# Patient Record
Sex: Female | Born: 1995 | Race: White | Hispanic: No | Marital: Single | Smoking: Former smoker
Health system: Southern US, Community
[De-identification: ages and names within clinical notes are randomized; demographics above are authoritative.]

## PROBLEM LIST (undated history)

## (undated) DIAGNOSIS — F419 Anxiety disorder, unspecified: Secondary | ICD-10-CM

## (undated) DIAGNOSIS — R87629 Unspecified abnormal cytological findings in specimens from vagina: Secondary | ICD-10-CM

## (undated) HISTORY — PX: OTHER SURGICAL HISTORY: SHX169

## (undated) HISTORY — PX: TONSILLECTOMY: SUR1361

## (undated) HISTORY — DX: Anxiety disorder, unspecified: F41.9

## (undated) HISTORY — DX: Unspecified abnormal cytological findings in specimens from vagina: R87.629

---
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
## 2014-08-31 ENCOUNTER — Emergency Department
Admission: EM | Admit: 2014-08-31 | Discharge: 2014-08-31 | Disposition: A | Discharge status: Home / Self Care | Payer: Commercial Managed Care - PPO | Source: Home / Self Care | Attending: Emergency Medicine | Admitting: Emergency Medicine

## 2014-08-31 ENCOUNTER — Encounter: Payer: Self-pay | Admitting: Emergency Medicine

## 2014-08-31 DIAGNOSIS — H109 Unspecified conjunctivitis: Secondary | ICD-10-CM

## 2014-08-31 MED ORDER — TOBRAMYCIN 0.3 % OP SOLN: 2.0000 [drp] | OPHTHALMIC | Status: DC

## 2014-08-31 NOTE — Discharge Instructions (Signed)
Bacterial Conjunctivitis °Bacterial conjunctivitis, commonly called pink eye, is an inflammation of the clear membrane that covers the white part of the eye (conjunctiva). The inflammation can also happen on the underside of the eyelids. The blood vessels in the conjunctiva become inflamed, causing the eye to become red or pink. Bacterial conjunctivitis may spread easily from one eye to another and from person to person (contagious).  °CAUSES  °Bacterial conjunctivitis is caused by bacteria. The bacteria may come from your own skin, your upper respiratory tract, or from someone else with bacterial conjunctivitis. °SYMPTOMS  °The normally white color of the eye or the underside of the eyelid is usually pink or red. The pink eye is usually associated with irritation, tearing, and some sensitivity to light. Bacterial conjunctivitis is often associated with a thick, yellowish discharge from the eye. The discharge may turn into a crust on the eyelids overnight, which causes your eyelids to stick together. If a discharge is present, there may also be some blurred vision in the affected eye. °DIAGNOSIS  °Bacterial conjunctivitis is diagnosed by your caregiver through an eye exam and the symptoms that you report. Your caregiver looks for changes in the surface tissues of your eyes, which may point to the specific type of conjunctivitis. A sample of any discharge may be collected on a cotton-tip swab if you have a severe case of conjunctivitis, if your cornea is affected, or if you keep getting repeat infections that do not respond to treatment. The sample will be sent to a lab to see if the inflammation is caused by a bacterial infection and to see if the infection will respond to antibiotic medicines. °TREATMENT  °· Bacterial conjunctivitis is treated with antibiotics. Antibiotic eyedrops are most often used. However, antibiotic ointments are also available. Antibiotics pills are sometimes used. Artificial tears or eye  washes may ease discomfort. °HOME CARE INSTRUCTIONS  °· To ease discomfort, apply a cool, clean washcloth to your eye for 10-20 minutes, 3-4 times a day. °· Gently wipe away any drainage from your eye with a warm, wet washcloth or a cotton ball. °· Wash your hands often with soap and water. Use paper towels to dry your hands. °· Do not share towels or washcloths. This may spread the infection. °· Change or wash your pillowcase every day. °· You should not use eye makeup until the infection is gone. °· Do not operate machinery or drive if your vision is blurred. °· Stop using contact lenses. Ask your caregiver how to sterilize or replace your contacts before using them again. This depends on the type of contact lenses that you use. °· When applying medicine to the infected eye, do not touch the edge of your eyelid with the eyedrop bottle or ointment tube. °SEEK IMMEDIATE MEDICAL CARE IF:  °· Your infection has not improved within 3 days after beginning treatment. °· You had yellow discharge from your eye and it returns. °· You have increased eye pain. °· Your eye redness is spreading. °· Your vision becomes blurred. °· You have a fever or persistent symptoms for more than 2-3 days. °· You have a fever and your symptoms suddenly get worse. °· You have facial pain, redness, or swelling. °MAKE SURE YOU:  °· Understand these instructions. °· Will watch your condition. °· Will get help right away if you are not doing well or get worse. °Document Released: 10/18/2005 Document Revised: 03/04/2014 Document Reviewed: 03/20/2012 °ExitCare® Patient Information ©2015 [employer]. This information is not intended to   replace advice given to you by your health care provider. Make sure you discuss any questions you have with your health care provider. ° °Sty °A sty (hordeolum) is an infection of a gland in the eyelid located at the base of the eyelash. A sty may develop a white or yellow head of pus. It can be puffy (swollen).  Usually, the sty will burst and pus will come out on its own. They do not leave lumps in the eyelid once they drain. °A sty is often confused with another form of cyst of the eyelid called a chalazion. Chalazions occur within the eyelid and not on the edge where the bases of the eyelashes are. They often are red, sore and then form firm lumps in the eyelid. °CAUSES  °· Germs (bacteria). °· Lasting (chronic) eyelid inflammation. °SYMPTOMS  °· Tenderness, redness and swelling along the edge of the eyelid at the base of the eyelashes. °· Sometimes, there is a white or yellow head of pus. It may or may not drain. °DIAGNOSIS  °An ophthalmologist will be able to distinguish between a sty and a chalazion and treat the condition appropriately.  °TREATMENT  °· Styes are typically treated with warm packs (compresses) until drainage occurs. °· In rare cases, medicines that kill germs (antibiotics) may be prescribed. These antibiotics may be in the form of drops, cream or pills. °· If a hard lump has formed, it is generally necessary to do a small incision and remove the hardened contents of the cyst in a minor surgical procedure done in the office. °· In suspicious cases, your caregiver may send the contents of the cyst to the lab to be certain that it is not a rare, but dangerous form of cancer of the glands of the eyelid. °HOME CARE INSTRUCTIONS  °· Wash your hands often and dry them with a clean towel. Avoid touching your eyelid. This may spread the infection to other parts of the eye. °· Apply heat to your eyelid for 10 to 20 minutes, several times a day, to ease pain and help to heal it faster. °· Do not squeeze the sty. Allow it to drain on its own. Wash your eyelid carefully 3 to 4 times per day to remove any pus. °SEEK IMMEDIATE MEDICAL CARE IF:  °· Your eye becomes painful or puffy (swollen). °· Your vision changes. °· Your sty does not drain by itself within 3 days. °· Your sty comes back within a short period of  time, even with treatment. °· You have redness (inflammation) around the eye. °· You have a fever. °Document Released: 07/28/2005 Document Revised: 01/10/2012 Document Reviewed: 02/01/2014 °ExitCare® Patient Information ©2015 [employer]. This information is not intended to replace advice given to you by your health care provider. Make sure you discuss any questions you have with your health care provider. ° °

## 2014-08-31 NOTE — ED Notes (Signed)
Pt complains of R eye itching, swelling and draining for 6 days.  Around her R eye is swollen and red.

## 2014-08-31 NOTE — ED Provider Notes (Signed)
CSN: [id number]     Arrival date & time 08/31/14  1016 History   First MD Initiated Contact with Patient 08/31/14 1024     Chief Complaint  Patient presents with  . Eye Drainage  . Facial Swelling    R eye   (Consider location/radiation/quality/duration/timing/severity/associated sxs/prior Treatment) Patient is a 18 y.o. female presenting with conjunctivitis. The history is provided by the patient. No language interpreter was used.  Conjunctivitis This is a new problem. The current episode started more than 1 week ago. The problem occurs constantly. The problem has been gradually worsening. Nothing aggravates the symptoms. Nothing relieves the symptoms. She has tried nothing for the symptoms. The treatment provided no relief.  Pt reports top eyelid swelled first, now bottom eyelid,  Pt has had drainage on and off.    History reviewed. No pertinent past medical history. Past Surgical History  Procedure Laterality Date  . Tonsillectomy    . Broken leg      screws and rods in L leg   History reviewed. No pertinent family history. History  Substance Use Topics  . Smoking status: Current Every Day Smoker  . Smokeless tobacco: Never Used  . Alcohol Use: No   OB History   Grav Para Term Preterm Abortions TAB SAB Ect Mult Living                 Review of Systems  All other systems reviewed and are negative.   Allergies  Review of patient's allergies indicates no known allergies.  Home Medications   Prior to Admission medications   Medication Sig Start Date End Date Taking? Authorizing Provider  tobramycin (TOBREX) 0.3 % ophthalmic solution Place 2 drops into the right eye every 4 (four) hours. 08/31/14   Elson AreasLeslie K Sofia, PA-C   BP 127/86  Pulse 103  Temp(Src) 97.6 F (36.4 C) (Oral)  Ht 5\' 7"  (1.702 m)  Wt 237 lb (107.502 kg)  BMI 37.11 kg/m2  SpO2 97%  LMP 08/26/2014 Physical Exam  Nursing note and vitals reviewed. Constitutional: She is oriented to person, place,  and time. She appears well-developed and well-nourished.  HENT:  Head: Normocephalic.  Eyes: Conjunctivae and EOM are normal.  Swollen lower eyelid  Possible sty forming.  Conjunctiva injected  Neck: Normal range of motion.  Pulmonary/Chest: Effort normal.  Abdominal: She exhibits no distension.  Musculoskeletal: Normal range of motion.  Neurological: She is alert and oriented to person, place, and time.  Psychiatric: She has a normal mood and affect.    ED Course  Procedures (including critical care time) Labs Review Labs Reviewed - No data to display  Imaging Review No results found.   MDM   1. Conjunctivitis of right eye    tobrex avs Return if any problems Warm compresses    Elson AreasLeslie K Sofia, PA-C 08/31/14 1057

## 2017-07-20 DIAGNOSIS — F411 Generalized anxiety disorder: Secondary | ICD-10-CM | POA: Insufficient documentation

## 2017-09-07 ENCOUNTER — Ambulatory Visit: Payer: Commercial Managed Care - PPO | Admitting: Obstetrics & Gynecology

## 2017-09-07 ENCOUNTER — Encounter: Payer: Self-pay | Admitting: Obstetrics & Gynecology

## 2017-09-07 VITALS — BP 115/75 | HR 85 | Ht 66.0 in | Wt 201.0 lb

## 2017-09-07 DIAGNOSIS — Z309 Encounter for contraceptive management, unspecified: Secondary | ICD-10-CM

## 2017-09-07 DIAGNOSIS — N940 Mittelschmerz: Secondary | ICD-10-CM | POA: Diagnosis not present

## 2017-09-07 DIAGNOSIS — Z319 Encounter for procreative management, unspecified: Secondary | ICD-10-CM

## 2017-09-07 LAB — TIQ-NTM

## 2017-09-07 MED ORDER — NORETHIN ACE-ETH ESTRAD-FE 1-20 MG-MCG(24) PO TABS: 1.0000 | ORAL_TABLET | Freq: Every day | ORAL | 11 refills | Status: DC

## 2017-09-07 NOTE — Progress Notes (Signed)
   Subjective:    Patient ID: Tonya HellerCourtney Smith, female    DOB: October 02, 1996, 21 y.o.   MRN: [id number]  HPI  Patient is a 21 year old female G0 P0 who presents for intermittent lower abdominal pain during intercourse. Patient states that the pain is mostly during ovulation and after her menstrual cycle. Patient has been seen back another gynecologist in town. She had a transvaginal ultrasound which showed a small follicular cyst. Patient was told there was nothing to be done about this pain. Patient denies any problems with urination. No signs of interstitial cystitis. Patient drinks a lot of water and has frequent urination. She gets up one time during the night. Patient denies any problems with bowel movements. She denies constipation diarrhea and bloody stools or mucus in her stools. Patient is not having pain currently. Patient was prescribed birth control pills by another physician but she has not taken them.  Review of Systems  Constitutional: Negative.   Respiratory: Negative.   Cardiovascular: Negative.   Gastrointestinal: Negative.   Genitourinary: Positive for pelvic pain and vaginal pain. Negative for vaginal bleeding and vaginal discharge.  Musculoskeletal: Negative.   Psychiatric/Behavioral: Negative.        Objective:   Physical Exam  Constitutional: She is oriented to person, place, and time. She appears well-developed and well-nourished. No distress.  HENT:  Head: Normocephalic and atraumatic.  Eyes: Conjunctivae are normal.  Pulmonary/Chest: Effort normal.  Neurological: She is alert and oriented to person, place, and time.  Skin: Skin is warm and dry.  Psychiatric: She has a normal mood and affect.  Vitals reviewed.  Vitals:   09/07/17 0845  BP: 115/75  Pulse: 85  Weight: 201 lb (91.2 kg)  Height: 5\' 6"  (1.676 m)       Assessment & Plan:  21 year old female with pain during ovulation and after her menstrual cycle. This can be normal cyclical pain. Patient  believes this is what it is. Lengthy discussion about endometriosis and pelvic inflammatory disease she has been tested for gonorrhea and chlamydia in the past. Patient mostly worried that there is something wrong that will prevent fertility in the future. She would like for "hormones "tested to make sure she is fertile. We talked about anti-mllerian hormone. There is a chance her insurance company will not pay for this test. The out of pocket costs $150. The patient agrees to have this test performed. Patient would like to go on birth control pills to help with this pain during ovulation. She knows she would not ovulate well the birth control pill.  Patient does not desire a child right now but will want to conceive in the next year or 2. Patient states her Pap smears up-to-date with her prior gynecologist.  30 minutes spent face-to-face with patient with greater than 50% counseling

## 2017-09-07 NOTE — Progress Notes (Signed)
PT states that she has intense pressure in her lower abdomen during intercourse.

## 2017-09-10 LAB — TEST AUTHORIZATION

## 2017-09-10 LAB — ANTI-MULLERIAN HORMONE (AMH), FEMALE: ANTI-MULLERIAN HORMONES(AMH),FEMALE: 2.3 ng/mL (ref 1.02–14.63)

## 2017-09-12 ENCOUNTER — Telehealth: Payer: Self-pay | Admitting: *Deleted

## 2017-09-12 NOTE — Telephone Encounter (Signed)
Pt notified of normal AMH per Dr Penne LashLeggett.

## 2017-09-12 NOTE — Telephone Encounter (Signed)
-----   Message from Lesly DukesKelly H Leggett, MD sent at 09/12/2017  1:42 PM EST ----- AMH is normal for age range

## 2018-03-15 ENCOUNTER — Encounter: Payer: Self-pay | Admitting: Obstetrics & Gynecology

## 2018-04-12 ENCOUNTER — Ambulatory Visit (INDEPENDENT_AMBULATORY_CARE_PROVIDER_SITE_OTHER): Payer: BLUE CROSS/BLUE SHIELD | Admitting: Obstetrics & Gynecology

## 2018-04-12 ENCOUNTER — Encounter (INDEPENDENT_AMBULATORY_CARE_PROVIDER_SITE_OTHER): Payer: Self-pay

## 2018-04-12 ENCOUNTER — Encounter: Payer: Self-pay | Admitting: Obstetrics & Gynecology

## 2018-04-12 VITALS — BP 120/78 | HR 92 | Ht 66.0 in | Wt 175.0 lb

## 2018-04-12 DIAGNOSIS — Z01419 Encounter for gynecological examination (general) (routine) without abnormal findings: Secondary | ICD-10-CM | POA: Diagnosis not present

## 2018-04-12 DIAGNOSIS — R7309 Other abnormal glucose: Secondary | ICD-10-CM

## 2018-04-12 DIAGNOSIS — Z124 Encounter for screening for malignant neoplasm of cervix: Secondary | ICD-10-CM

## 2018-04-12 DIAGNOSIS — Z113 Encounter for screening for infections with a predominantly sexual mode of transmission: Secondary | ICD-10-CM | POA: Diagnosis not present

## 2018-04-12 MED ORDER — NORGESTREL-ETHINYL ESTRADIOL 0.3-30 MG-MCG PO TABS: 1.0000 | ORAL_TABLET | Freq: Every day | ORAL | 11 refills | Status: DC

## 2018-04-12 NOTE — Progress Notes (Signed)
Subjective:    Tonya HellerCourtney Bancroft is a 22 y.o. single G0 female who presents for an annual exam. The patient has no complaints today. The patient is sexually active. GYN screening history: last pap: was normal. But she has a h/o abnormal pap. The patient wears seatbelts: yes. The patient participates in regular exercise: yes. Has the patient ever been transfused or tattooed?: yes. The patient reports that there is not domestic violence in her life.   Menstrual History: OB History    Gravida  0   Para  0   Term  0   Preterm  0   AB  0   Living  0     SAB  0   TAB  0   Ectopic  0   Multiple  0   Live Births  0           Menarche age: [age] Patient's last menstrual period was 04/01/2018.    The following portions of the patient's history were reviewed and updated as appropriate: allergies, current medications, past family history, past medical history, past social history, past surgical history and problem list.  Review of Systems Pertinent items are noted in HPI.   FH- + breast cancer in maternal great GM, + cervical in maternal great GM, no colon cancer Monogamous since last year She uses withdrawal for contraception. 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    Objective:    BP 120/78   Pulse 92   Ht 5\' 6"  (1.676 m)   Wt 175 lb (79.4 kg)   LMP 04/01/2018   BMI 28.25 kg/m   General Appearance:    Alert, cooperative, no distress, appears stated age  Head:    Normocephalic, without obvious abnormality, atraumatic  Eyes:    PERRL, conjunctiva/corneas clear, EOM's intact, fundi    benign, both eyes  Ears:    Normal TM's and external ear canals, both ears  Nose:   Nares normal, septum midline, mucosa normal, no drainage    or sinus tenderness  Throat:   Lips, mucosa, and tongue normal; teeth and gums normal  Neck:   Supple, symmetrical, trachea midline, no adenopathy;    thyroid:  no enlargement/tenderness/nodules; no carotid   bruit or  JVD  Back:     Symmetric, no curvature, ROM normal, no CVA tenderness  Lungs:     Clear to auscultation bilaterally, respirations unlabored  Chest Wall:    No tenderness or deformity   Heart:    Regular rate and rhythm, S1 and S2 normal, no murmur, rub   or gallop  Breast Exam:    No tenderness, masses, or nipple abnormality  Abdomen:     Soft, non-tender, bowel sounds active all four quadrants,    no masses, no organomegaly  Genitalia:    Normal female without lesion, discharge or tenderness, normal size and shape, anteverted, mobile, non-tender, normal adnexal exam      Extremities:   Extremities normal, atraumatic, no cyanosis or edema  Pulses:   2+ and symmetric all extremities  Skin:   Skin color, texture, turgor normal, no rashes or lesions  Lymph nodes:   Cervical, supraclavicular, and axillary nodes normal  Neurologic:   CNII-XII intact, normal strength, sensation and reflexes    throughout  .    Assessment:    Healthy female exam.    Plan:     Thin prep Pap smear. witih STI testing Lo ovral prescribed for pelvic pain

## 2018-04-13 LAB — CYTOLOGY - PAP
CHLAMYDIA, DNA PROBE: NEGATIVE
DIAGNOSIS: NEGATIVE
Neisseria Gonorrhea: NEGATIVE

## 2018-04-13 LAB — RPR: RPR Ser Ql: NONREACTIVE

## 2018-04-13 LAB — HIV ANTIBODY (ROUTINE TESTING W REFLEX): HIV: NONREACTIVE

## 2018-04-13 LAB — HEPATITIS C ANTIBODY
Hepatitis C Ab: NONREACTIVE
SIGNAL TO CUT-OFF: 0.02 (ref <1.00)

## 2018-04-13 LAB — HEMOGLOBIN A1C
Hgb A1c MFr Bld: 5.3 % of total Hgb (ref <5.7)
Mean Plasma Glucose: 105 (calc)
eAG (mmol/L): 5.8 (calc)

## 2018-04-13 LAB — HEPATITIS B SURFACE ANTIGEN: HEP B S AG: NONREACTIVE

## 2018-04-19 ENCOUNTER — Encounter: Payer: Self-pay | Admitting: Obstetrics & Gynecology

## 2018-05-15 DIAGNOSIS — Z720 Tobacco use: Secondary | ICD-10-CM | POA: Diagnosis present

## 2018-06-12 ENCOUNTER — Ambulatory Visit: Payer: BLUE CROSS/BLUE SHIELD | Admitting: Obstetrics & Gynecology

## 2018-11-01 NOTE — L&D Delivery Note (Addendum)
Vaginal Delivery Note At 6:37 PM a viable female was delivered via Vaginal, Spontaneous.  Presentation: vertex; Position: Left,, Occiput,, Anterior;   Delivery of the head: 06/15/2019  6:36 PM First maneuver: 06/15/2019  6:36 PM, McRoberts Second maneuver: 06/15/2019 6:36 PM, Anterior axilla released Total time until delivery: approx 10 seconds  Verbal consent: unable to obtain verbal consent due to urgency of the situation at hand.  APGAR: 6, 8 Weight: 3359gm (7lb 6.5oz)   Placenta status: spontaneous, intact.   Cord: 3 vessel with the following complications: nuchal x1.   Cord pH: collected   Anesthesia: Epidural  Episiotomy: None Lacerations: 1st degree; hemostatic Suture Repair: N/A  Est. Blood Loss (mL): 50  Mom to postpartum.  Baby to Couplet care / Skin to Skin.  Gerlene Fee, DO Family Medicine PGY-1 06/15/2019, 7:01 PM _____  Patient is a G1P0000 at [redacted]w[redacted]d who was admitted for IOL due to PPROM, significant hx of uncomplicated prenatal course.  She progressed with augmentation via cytotec/cervical foley/Pitocin.  I was gloved and present for delivery in its entirety.  Second stage of labor progressed, baby delivered after being complete x 17 mins.  Mod decels during second stage noted.  Complications: tight shoulders (McRoberts- see above)  Lacerations: no repair  EBL: 50cc  Myrtis Ser, CNM 9:54 PM  06/15/2019   Please schedule this patient for Postpartum visit in: 4 weeks with the following provider: Any provider For C/S patients schedule nurse incision check in weeks 2 weeks: no Low risk pregnancy complicated by: PPROM/preterm delivery Delivery mode:  SVD Anticipated Birth Control:  IUD- placed outpatient PP Procedures needed: none  Schedule Integrated BH visit: no

## 2018-12-06 ENCOUNTER — Ambulatory Visit (INDEPENDENT_AMBULATORY_CARE_PROVIDER_SITE_OTHER): Payer: BLUE CROSS/BLUE SHIELD | Admitting: Obstetrics and Gynecology

## 2018-12-06 ENCOUNTER — Encounter: Payer: Self-pay | Admitting: Obstetrics and Gynecology

## 2018-12-06 VITALS — BP 136/72 | HR 112 | Wt 180.0 lb

## 2018-12-06 DIAGNOSIS — Z3400 Encounter for supervision of normal first pregnancy, unspecified trimester: Secondary | ICD-10-CM

## 2018-12-06 DIAGNOSIS — Z3401 Encounter for supervision of normal first pregnancy, first trimester: Secondary | ICD-10-CM

## 2018-12-06 DIAGNOSIS — Z23 Encounter for immunization: Secondary | ICD-10-CM | POA: Diagnosis not present

## 2018-12-06 DIAGNOSIS — Z113 Encounter for screening for infections with a predominantly sexual mode of transmission: Secondary | ICD-10-CM | POA: Diagnosis not present

## 2018-12-06 NOTE — Progress Notes (Signed)
Subjective:   Tonya Smith is a 23 y.o. G1P0000 at 8457w4d by LMP being seen today for her first obstetrical visit.  Her obstetrical history is significant for anxiety. Patient does intend to breast feed. Pregnancy history fully reviewed.  Patient reports no complaints.  HISTORY: OB History  Gravida Para Term Preterm AB Living  1 0 0 0 0 0  SAB TAB Ectopic Multiple Live Births  0 0 0 0 0    # Outcome Date GA Lbr Len/2nd Weight Sex Delivery Anes PTL Lv  1 Current             Last pap smear was done on 04/13/2018 and was normal  Past Medical History:  Diagnosis Date  . Anxiety   . Vaginal Pap smear, abnormal    Past Surgical History:  Procedure Laterality Date  . broken leg     screws and rods in L leg  . TONSILLECTOMY    . wisdom teeth removal     Family History  Problem Relation Age of Onset  . Diabetes Father   . Hypertension Father    Social History   Tobacco Use  . Smoking status: Current Every Day Smoker    Packs/day: 1.00  . Smokeless tobacco: Never Used  Substance Use Topics  . Alcohol use: Yes    Comment: Rarely  . Drug use: No   No Known Allergies Current Outpatient Medications on File Prior to Visit  Medication Sig Dispense Refill  . Prenatal Vit-Fe Fumarate-FA (PRENATAL VITAMIN PO) Take by mouth.     No current facility-administered medications on file prior to visit.     Review of Systems Pertinent items noted in HPI and remainder of comprehensive ROS otherwise negative.  Exam   Vitals:   12/06/18 1315  BP: 136/72  Pulse: (!) 112  Weight: 180 lb (81.6 kg)   Fetal Heart Rate (bpm): 148  BP 136/72   Pulse (!) 112   Wt 180 lb (81.6 kg)   LMP 09/30/2018   BMI 29.05 kg/m  Uterine Size: size equals dates  System: Breast:  No nipple retraction or dimpling, No nipple discharge or bleeding, No axillary or supraclavicular adenopathy, Normal to palpation without dominant masses   Skin: normal coloration and turgor, no rashes    Neurologic: negative   Extremities: normal strength, tone, and muscle mass   HEENT neck supple with midline trachea and thyroid without masses   Mouth/Teeth mucous membranes moist, pharynx normal without lesions   Neck supple and no masses   Cardiovascular: regular rate and rhythm, no murmurs or gallops   Respiratory:  appears well, vitals normal, no respiratory distress, acyanotic, normal RR, neck free of mass or lymphadenopathy, chest clear, no wheezing, crepitations, rhonchi, normal symmetric air entry   Abdomen: soft, non-tender; bowel sounds normal; no masses,  no organomegaly   Bedside Ultrasound for FHR check: Patient informed that the ultrasound is considered a limited obstetric ultrasound and is not intended to be a complete ultrasound exam.  Patient also informed that the ultrasound is not being completed with the intent of assessing for fetal or placental anomalies or any pelvic abnormalities.  Explained that the purpose of today's ultrasound is to assess for fetal heart rate.  Patient acknowledges the purpose of the exam and the limitations of the study.   Assessment:   Pregnancy: G1P0000 Patient Active Problem List   Diagnosis Date Noted  . Supervision of low-risk first pregnancy 12/06/2018  . 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  Generalized anxiety disorder 07/20/2017     Plan:  1. Encounter for supervision of low-risk first pregnancy, antepartum  - Obstetric panel - HIV antibody (with reflex) - Culture, OB Urine - GC/Chlamydia probe amp ([place])not at [facility] [place] bedside; Future - Cystic fibrosis diagnostic study - Babyscripts Schedule Optimization -Flu shot today     Initial labs drawn. Continue prenatal vitamins. Genetic Screening discussed, NIPS: requested. Ultrasound discussed; fetal anatomic survey: requested. Problem list reviewed and updated. The nature of  - [facility] with multiple MDs and other Advanced Practice Providers  was explained to patient; also emphasized that residents, students are part of our team. Routine obstetric precautions reviewed. Return for after 15 weeks for AFP, BRX opt schedule after .    Jaxiel Kines, Harolyn Rutherford, NP  [facility] for [employer], [facility]

## 2018-12-06 NOTE — Patient Instructions (Signed)
Childbirth Education Options: [facility] Classes:  Childbirth education classes can help you get ready for a positive parenting experience. You can also meet other expectant parents and get free stuff for your baby. Each class runs for five weeks on the same night and costs $45 for the mother-to-be and her support person. Medicaid covers the cost if you are eligible. Call [phone] to register. [facility] Childbirth Education:  [phone] or [phone] or [email or web address]  Baby & Me Class: Discuss newborn & infant parenting and family adjustment issues with other new mothers in a relaxed environment. Each week brings a new speaker or baby-centered activity. We encourage new mothers to join us every Thursday at 11:00am. Babies birth until crawling. No registration or fee. Daddy Boot Camp: This course offers Dads-to-be the tools and knowledge needed to feel confident on their journey to becoming new fathers. Experienced dads, who have been trained as coaches, teach dads-to-be how to hold, comfort, diaper, swaddle and play with their infant while being able to support the new mom as well. A class for men taught by men. $25/dad Big Brother/Big Sister: Let your children share in the joy of a new brother or sister in this special class designed just for them. Class includes discussion about how families care for babies: swaddling, holding, diapering, safety as well as how they can be helpful in their new role. This class is designed for children ages 2 to 6, but any age is welcome. Please register each child individually. $5/child  Mom Talk: This mom-led group offers support and connection to mothers as they journey through the adjustments and struggles of that sometimes overwhelming first year after the birth of a child. Tuesdays at 10:00am and Thursdays at 6:00pm. Babies welcome. No registration or fee. Breastfeeding Support Group: This group is a mother-to-mother  support circle where moms have the opportunity to share their breastfeeding experiences. A Lactation Consultant is present for questions and concerns. Meets each Tuesday at 11:00am. No fee or registration. Breastfeeding Your Baby: Learn what to expect in the first days of breastfeeding your newborn.  This class will help you feel more confident with the skills needed to begin your breastfeeding experience. Many new mothers are concerned about breastfeeding after leaving the hospital. This class will also address the most common fears and challenges about breastfeeding during the first few weeks, months and beyond. (call for fee) Comfort Techniques and Tour: This 2 hour interactive class will provide you the opportunity to learn & practice hands-on techniques that can help relieve some of the discomfort of labor and encourage your baby to rotate toward the best position for birth. You and your partner will be able to try a variety of labor positions with birth balls and rebozos as well as practice breathing, relaxation, and visualization techniques. A tour of the [facility] is included with this class. $20 per registrant and support person Childbirth Class- Weekend Option: This class is a Weekend version of our Birth & Baby series. It is designed for parents who have a difficult time fitting several weeks of classes into their schedule. It covers the care of your newborn and the basics of labor and childbirth. It also includes a Maternity Care Center Tour of [facility] and lunch. The class is held two consecutive days: beginning on Friday evening from 6:30 - 8:30 p.m. and the next day, Saturday from 9 a.m. - 4 p.m. (call for fee) Waterbirth Class: Interested in a waterbirth?  This   informational class will help you discover whether waterbirth is the right fit for you. Education about waterbirth itself, supplies you would need and how to assemble your support team is what you can  expect from this class. Some obstetrical practices require this class in order to pursue a waterbirth. (Not all obstetrical practices offer waterbirth-check with your healthcare provider.) Register only the expectant mom, but you are encouraged to bring your partner to class! Required if planning waterbirth, no fee. Infant/Child CPR: Parents, grandparents, babysitters, and friends learn Cardio-Pulmonary Resuscitation skills for infants and children. You will also learn how to treat both conscious and unconscious choking in infants and children. This Family & Friends program does not offer certification. Register each participant individually to ensure that enough mannequins are available. (Call for fee) Grandparent Love: Expecting a grandbaby? This class is for you! Learn about the latest infant care and safety recommendations and ways to support your own child as he or she transitions into the parenting role. Taught by Registered Nurses who are childbirth instructors, but most importantly...they are grandmothers too! $10/person. Childbirth Class- Natural Childbirth: This series of 5 weekly classes is for expectant parents who want to learn and practice natural methods of coping with the process of labor and childbirth. Relaxation, breathing, massage, visualization, role of the partner, and helpful positioning are highlighted. Participants learn how to be confident in their body's ability to give birth. This class will empower and help parents make informed decisions about their own care. Includes discussion that will help new parents transition into the immediate postpartum period. Maternity Care Center Tour of [facility] is included. We suggest taking this class between 25-32 weeks, but it's only a recommendation. $75 per registrant and one support person or $30 Medicaid. Childbirth Class- 3 week Series: This option of 3 weekly classes helps you and your labor partner prepare for childbirth. Newborn  care, labor & birth, cesarean birth, pain management, and comfort techniques are discussed and a Maternity Care Center Tour of [facility] is included. The class meets at the same time, on the same day of the week for 3 consecutive weeks beginning with the starting date you choose. $60 for registrant and one support person.  Marvelous Multiples: Expecting twins, triplets, or more? This class covers the differences in labor, birth, parenting, and breastfeeding issues that face multiples' parents. NICU tour is included. Led by a Certified Childbirth Educator who is the mother of twins. No fee. Caring for Baby: This class is for expectant and adoptive parents who want to learn and practice the most up-to-date newborn care for their babies. Focus is on birth through the first six weeks of life. Topics include feeding, bathing, diapering, crying, umbilical cord care, circumcision care and safe sleep. Parents learn to recognize symptoms of illness and when to call the pediatrician. Register only the mom-to-be and your partner or support person can plan to come with you! $10 per registrant and support person Childbirth Class- online option: This online class offers you the freedom to complete a Birth and Baby series in the comfort of your own home. The flexibility of this option allows you to review sections at your own pace, at times convenient to you and your support people. It includes additional video information, animations, quizzes, and extended activities. Get organized with helpful eClass tools, checklists, and trackers. Once you register online for the class, you will receive an email within a few days to accept the invitation and begin the class when the time   is right for you. The content will be available to you for 60 days. $60 for 60 days of online access for you and your support people.  Local Doulas: Natural Baby Doulas [email or web address] Tel:  [phone] [email or web address] [facility] [phone] [email or web address] The Labor Ladies  (also do waterbirth tub rental) [phone] [email or web address] [facility] [phone] [email or web address] Sacred Rhythms  [phone] [email or web address] [place] [employer] (PADA) [email or web address] http://www.padanc.org/index.htm [place] Birth and Baby  [email or web address] Considering Waterbirth? Guide for patients at [facility]  Why consider waterbirth?  . Gentle birth for babies . Less pain medicine used in labor . May allow for passive descent/less pushing . May reduce perineal tears  . More mobility and instinctive maternal position changes . Increased maternal relaxation . Reduced blood pressure in labor  Is waterbirth safe? What are the risks of infection, drowning or other complications?  . Infection: o Very low risk (3.7 % for tub vs 4.8% for bed) o 7 in 8000 waterbirths with documented infection o Poorly cleaned equipment most common cause o Slightly lower group B strep transmission rate  . Drowning o Maternal:  - Very low risk   - Related to seizures or fainting o Newborn:  - Very low risk. No evidence of increased risk of respiratory problems in multiple large studies - Physiological protection from breathing under water - Avoid underwater birth if there are any fetal complications - Once baby's head is out of the water, keep it out.  . Birth complication o Some reports of cord trauma, but risk decreased by bringing baby to surface gradually o No evidence of increased risk of shoulder dystocia. Mothers can usually change positions faster in water than in a bed, possibly aiding the maneuvers to free the shoulder.   You must attend a Waterbirth class at [facility]  3rd Wednesday of every month from 7-9pm  Free  Register by calling [phone] or online at [email or web address]  Bring us the certificate from the class to your prenatal appointment  Meet with a midwife at 36 weeks to see if you can still plan a waterbirth and to sign the consent.   Purchase or rent the following supplies:   Water Birth Pool (Birth Pool in a Box or [place] for instance)  (Tubs start ~$125)  Single-use disposable tub liner designed for your brand of tub  New garden hose labeled "lead-free", "suitable for drinking water",  Electric drain pump to remove water (We recommend 792 gallon per hour or greater pump.)   Separate garden hose to remove the dirty water  Fish net  Bathing suit top (optional)  Long-handled mirror (optional)  Places to purchase or rent supplies  Yourwaterbirth.com for tub purchases and supplies  Waterbirthsolutions.com for tub purchases and supplies  The Labor Ladies (www.[email or web address]) $275 for tub rental/set-up & take down/kit   [place] [employer] (http://www.padanc.org/MeetUs.htm) Information regarding doulas (labor support) who provide pool rentals  Our practice has a Birth Pool in a Box tub at the hospital that you may borrow on a first-come-first-served basis. It is your responsibility to to set up, clean and break down the tub. We cannot guarantee the availability of this tub in advance. You are responsible for bringing all accessories listed above. If you do not have all necessary supplies you cannot have a waterbirth.    Things that would prevent you from having a waterbirth:  Premature, <37wks  Previous cesarean birth  Presence of thick meconium-stained fluid  Multiple gestation (Twins,   triplets, etc.)  Uncontrolled diabetes or gestational diabetes requiring medication  Hypertension requiring medication or diagnosis of pre-eclampsia  Heavy vaginal bleeding  Non-reassuring fetal  heart rate  Active infection (MRSA, etc.). Group B Strep is NOT a contraindication for  waterbirth.  If your labor has to be induced and induction method requires continuous  monitoring of the baby's heart rate  Other risks/issues identified by your obstetrical provider  Please remember that birth is unpredictable. Under certain unforeseeable circumstances your provider may advise against giving birth in the tub. These decisions will be made on a case-by-case basis and with the safety of you and your baby as our highest priority.   AREA PEDIATRIC/FAMILY PRACTICE PHYSICIANS  [facility] [address], [address] [place], [place]  [address] Phone - [phone]   Fax - [phone]  [facility] [address] [place], [place] [address] Phone - [phone]   Fax - [phone]  [facility] [address] [place], [place]  [address] Phone - [phone]   Fax - [phone]  [facility] [address] [place], [place]  [address] Phone - [phone]   Fax - [phone]  [facility] [address] [place], [place]  [address] Phone - [phone]   Fax - [phone]  [facility] [address] [place], [place]  [address] Phone - [phone]   Fax - [phone]  [facility] [address] [place], [place]  [address] Phone - [phone]   Fax - [phone]  [facility] [address] [place], [place]  [address] Phone - [phone]   Fax - [phone]  [facility] [address] [place], [place]  [address] Phone - [phone]   Fax - [phone] [facility] [address] [place], [place]  [address] Phone - [phone]   Fax - [phone]  [facility] [address] [place], [place]  [address] Phone -  [phone]   Fax - [phone]  [facility] [address] [place], [place]  [address] Phone - [phone]   Fax - [phone]  [facility] [address] [place], [place]  [address] Phone - [phone]   Fax - [phone]  [facility]IL[facility] GOSR[facility] [address] [place], [place]  [address] Phone - [phone]  [facility] [address] [place], [place]  [address] Phone - [phone]   Fax - [phone]  [facility] [address] [place], [place]  [address] Phone - [phone]   Fax - [phone]  [facility] [address] [place], [place]  [address] Phone - [phone]   Fax - [phone]  [facility] [address] [place], [place]  [address] Phone - [phone]   Fax - [phone]   [facility] [address] [place], [place]  [address] Phone - [phone]   Fax - [phone]  [facility] [address] [place], [place]  [address] Phone - [phone]   Fax - [phone]  [facility] [address] [place], [place]  [address] Phone - [phone]   Fax - [phone]  [facility] [address] , [place]  [address] Phone - [phone]   Fax - [phone]  [facility] [address] [place], [place]  [address] Phone - [phone]   Fax - [phone] [facility] [address] [place], [place]  [address] Phone - [phone]   Fax - [phone]  [facility] [address] [place], [place]  [address] Phone - [phone]   Fax - [phone]  [facility] [address] [place], [place]  [address] Phone - [phone]   Fax - [phone]  [facility] Charlene Flemming MD [address] [place]  [address] Phone [phone]  Fax [phone]     

## 2018-12-06 NOTE — Progress Notes (Signed)
Bedside U/S shows single IUP with FHT of 148 BPM and CRL meas 26.38mm  GA is [redacted]w[redacted]d

## 2018-12-07 LAB — GC/CHLAMYDIA PROBE AMP (~~LOC~~) NOT AT ARMC
CHLAMYDIA, DNA PROBE: NEGATIVE
Neisseria Gonorrhea: NEGATIVE

## 2018-12-08 LAB — CULTURE, OB URINE

## 2018-12-08 LAB — URINE CULTURE, OB REFLEX: ORGANISM ID, BACTERIA: NO GROWTH

## 2018-12-11 ENCOUNTER — Encounter: Payer: BLUE CROSS/BLUE SHIELD | Admitting: Obstetrics & Gynecology

## 2018-12-18 LAB — OBSTETRIC PANEL
Absolute Monocytes: 866 cells/uL (ref 200–950)
Antibody Screen: NOT DETECTED
BASOS PCT: 0.4 %
Basophils Absolute: 46 cells/uL (ref 0–200)
EOS PCT: 0.5 %
Eosinophils Absolute: 57 cells/uL (ref 15–500)
HEMATOCRIT: 36.7 % (ref 35.0–45.0)
HEMOGLOBIN: 12.4 g/dL (ref 11.7–15.5)
Hepatitis B Surface Ag: NONREACTIVE
Lymphs Abs: 2725 cells/uL (ref 850–3900)
MCH: 30.8 pg (ref 27.0–33.0)
MCHC: 33.8 g/dL (ref 32.0–36.0)
MCV: 91.3 fL (ref 80.0–100.0)
MONOS PCT: 7.6 %
MPV: 11.8 fL (ref 7.5–12.5)
Neutro Abs: 7706 cells/uL (ref 1500–7800)
Neutrophils Relative %: 67.6 %
PLATELETS: 220 10*3/uL (ref 140–400)
RBC: 4.02 10*6/uL (ref 3.80–5.10)
RDW: 12.5 % (ref 11.0–15.0)
RPR Ser Ql: NONREACTIVE
Rubella: 1.51 index
TOTAL LYMPHOCYTE: 23.9 %
WBC: 11.4 10*3/uL — ABNORMAL HIGH (ref 3.8–10.8)

## 2018-12-18 LAB — CYSTIC FIBROSIS DIAGNOSTIC STUDY

## 2018-12-18 LAB — HIV ANTIBODY (ROUTINE TESTING W REFLEX): HIV 1&2 Ab, 4th Generation: NONREACTIVE

## 2018-12-18 LAB — SPINAL MUSCULAR ATROPHY CARRIER: SMN1: 2

## 2019-01-15 ENCOUNTER — Encounter: Payer: BLUE CROSS/BLUE SHIELD | Admitting: Obstetrics & Gynecology

## 2019-01-25 ENCOUNTER — Encounter: Payer: Self-pay | Admitting: Obstetrics and Gynecology

## 2019-01-25 ENCOUNTER — Other Ambulatory Visit: Payer: Self-pay

## 2019-01-25 ENCOUNTER — Ambulatory Visit (INDEPENDENT_AMBULATORY_CARE_PROVIDER_SITE_OTHER): Payer: BLUE CROSS/BLUE SHIELD | Admitting: Obstetrics and Gynecology

## 2019-01-25 VITALS — BP 109/68 | HR 86 | Wt 190.0 lb

## 2019-01-25 DIAGNOSIS — Z3A16 16 weeks gestation of pregnancy: Secondary | ICD-10-CM | POA: Diagnosis not present

## 2019-01-25 DIAGNOSIS — Z3402 Encounter for supervision of normal first pregnancy, second trimester: Secondary | ICD-10-CM

## 2019-01-25 NOTE — Progress Notes (Signed)
   PRENATAL VISIT NOTE  Subjective:  Tonya Smith is a 23 y.o. G1P0000 at [redacted]w[redacted]d being seen today for ongoing prenatal care.  She is currently monitored for the following issues for this low-risk pregnancy and has Generalized anxiety disorder; Mittelschmerz; and Supervision of low-risk first pregnancy on their problem list.  Patient reports no complaints.  Contractions: Not present. Vag. Bleeding: None.  Movement: Absent. Denies leaking of fluid.   The following portions of the patient's history were reviewed and updated as appropriate: allergies, current medications, past family history, past medical history, past social history, past surgical history and problem list.   Objective:   Vitals:   01/25/19 1358  BP: 109/68  Pulse: 86  Weight: 190 lb (86.2 kg)    Fetal Status: Fetal Heart Rate (bpm): 156   Movement: Absent     General:  Alert, oriented and cooperative. Patient is in no acute distress.  Skin: Skin is warm and dry. No rash noted.   Cardiovascular: Normal heart rate noted  Respiratory: Normal respiratory effort, no problems with respiration noted  Abdomen: Soft, gravid, appropriate for gestational age.        Pelvic: Cervical exam deferred        Extremities: Normal range of motion.  Edema: None  Mental Status: Normal mood and affect. Normal behavior. Normal judgment and thought content.   Assessment and Plan:  Pregnancy: G1P0000 at [redacted]w[redacted]d 1. Encounter for supervision of normal first pregnancy in second trimester Patient is doing well without complaints AFP today Anatomy ultrasound ordered Patient enrolled in BRx optimization and understand that her following visit may be via telehealth pending covid-19 pandemic status - Alpha fetoprotein, maternal - [place] MFM OB COMP + 14 WK; Future   Preterm labor symptoms and general obstetric precautions including but not limited to vaginal bleeding, contractions, leaking of fluid and fetal movement were reviewed in detail with the  patient. Please refer to After Visit Summary for other counseling recommendations.   Return in about 5 weeks (around 03/01/2019) for ROB, telehealth potential.  No future appointments.  Catalina Antigua, MD

## 2019-01-26 LAB — ALPHA FETOPROTEIN, MATERNAL
AFP MoM: 1.86
AFP, SERUM: 58.8 ng/mL
CALC'D GESTATIONAL AGE: 16.7 wk
MATERNAL WT: 180 [lb_av]
Risk for ONTD: 1
Twins-AFP: 1

## 2019-01-26 LAB — TIQ-AOE

## 2019-02-05 ENCOUNTER — Ambulatory Visit (HOSPITAL_COMMUNITY)
Admission: RE | Admit: 2019-02-05 | Discharge: 2019-02-05 | Disposition: A | Discharge status: Home / Self Care | Payer: BLUE CROSS/BLUE SHIELD | Source: Ambulatory Visit | Attending: Obstetrics and Gynecology | Admitting: Obstetrics and Gynecology

## 2019-02-05 ENCOUNTER — Other Ambulatory Visit: Payer: Self-pay

## 2019-02-05 ENCOUNTER — Other Ambulatory Visit (HOSPITAL_COMMUNITY): Payer: Self-pay | Admitting: *Deleted

## 2019-02-05 ENCOUNTER — Other Ambulatory Visit: Payer: Self-pay | Admitting: Obstetrics and Gynecology

## 2019-02-05 DIAGNOSIS — Z3A18 18 weeks gestation of pregnancy: Secondary | ICD-10-CM | POA: Diagnosis not present

## 2019-02-05 DIAGNOSIS — Z362 Encounter for other antenatal screening follow-up: Secondary | ICD-10-CM

## 2019-02-05 DIAGNOSIS — O99332 Smoking (tobacco) complicating pregnancy, second trimester: Secondary | ICD-10-CM

## 2019-02-05 DIAGNOSIS — O99212 Obesity complicating pregnancy, second trimester: Secondary | ICD-10-CM | POA: Diagnosis not present

## 2019-02-05 DIAGNOSIS — Z363 Encounter for antenatal screening for malformations: Secondary | ICD-10-CM

## 2019-02-05 DIAGNOSIS — Z3402 Encounter for supervision of normal first pregnancy, second trimester: Secondary | ICD-10-CM

## 2019-02-28 ENCOUNTER — Telehealth: Payer: Self-pay | Admitting: *Deleted

## 2019-02-28 MED ORDER — FAMOTIDINE 20 MG PO TABS: 20.0000 mg | ORAL_TABLET | Freq: Two times a day (BID) | ORAL | 5 refills | Status: DC

## 2019-02-28 NOTE — Telephone Encounter (Signed)
Pt called requesting a RX for Pepcid for heartburn in her pregnancy.  OTC Pepcid is now hard to find due to Covid-19.  RX sent to [facility]

## 2019-03-05 ENCOUNTER — Encounter: Payer: BLUE CROSS/BLUE SHIELD | Admitting: Obstetrics & Gynecology

## 2019-03-06 ENCOUNTER — Other Ambulatory Visit: Payer: Self-pay

## 2019-03-06 ENCOUNTER — Ambulatory Visit (INDEPENDENT_AMBULATORY_CARE_PROVIDER_SITE_OTHER): Payer: BLUE CROSS/BLUE SHIELD | Admitting: Advanced Practice Midwife

## 2019-03-06 VITALS — BP 130/80 | HR 97 | Wt 200.0 lb

## 2019-03-06 DIAGNOSIS — R12 Heartburn: Secondary | ICD-10-CM

## 2019-03-06 DIAGNOSIS — O26893 Other specified pregnancy related conditions, third trimester: Secondary | ICD-10-CM

## 2019-03-06 DIAGNOSIS — O26892 Other specified pregnancy related conditions, second trimester: Secondary | ICD-10-CM

## 2019-03-06 DIAGNOSIS — Z3A22 22 weeks gestation of pregnancy: Secondary | ICD-10-CM

## 2019-03-06 DIAGNOSIS — Z3402 Encounter for supervision of normal first pregnancy, second trimester: Secondary | ICD-10-CM

## 2019-03-06 MED ORDER — PANTOPRAZOLE SODIUM 40 MG PO TBEC: 40.0000 mg | DELAYED_RELEASE_TABLET | Freq: Every day | ORAL | 5 refills | Status: DC

## 2019-03-06 NOTE — Progress Notes (Signed)
   TELEHEALTH VIRTUAL OBSTETRICS PRENATAL VISIT ENCOUNTER NOTE  I connected with Tonya Smith on 03/06/19 at  8:35 AM EDT by WebEx at home and verified that I am speaking with the correct person using two identifiers.   I discussed the limitations, risks, security and privacy concerns of performing an evaluation and management service by telephone and the availability of in person appointments. I also discussed with the patient that there may be a patient responsible charge related to this service. The patient expressed understanding and agreed to proceed. Subjective:  Tonya Smith is a 23 y.o. G1P0000 at [redacted]w[redacted]d being seen today for ongoing prenatal care.  She is currently monitored for the following issues for this low-risk pregnancy and has Generalized anxiety disorder; Mittelschmerz; and Supervision of low-risk first pregnancy on their problem list.  Patient reports heartburn.  Reports fetal movement. Contractions: Not present. Vag. Bleeding: None.  Movement: Present. Denies any contractions, bleeding or leaking of fluid.   The following portions of the patient's history were reviewed and updated as appropriate: allergies, current medications, past family history, past medical history, past social history, past surgical history and problem list.   Objective:   Vitals:   03/06/19 0851  BP: 130/80  Pulse: 97  Weight: 90.7 kg    Fetal Status:     Movement: Present     General:  Alert, oriented and cooperative. Patient is in no acute distress.  Respiratory: Normal respiratory effort, no problems with respiration noted  Mental Status: Normal mood and affect. Normal behavior. Normal judgment and thought content.  Rest of physical exam deferred due to type of encounter  Assessment and Plan:  Pregnancy: G1P0000 at [redacted]w[redacted]d 1. Encounter for supervision of normal first pregnancy in second trimester --Pt reports good fetal movement, denies cramping, LOF, or frequent cramping --Anticipatory  guidance about next visits/weeks of pregnancy given. --Reviewed safety, visitor policy, reassurance about COVID-19 for pregnancy at this time. Discussed possible changes to visits, including televisits, that may occur due to COVID-19.  The office remains open if pt needs to be seen and MAU is open 24 hours/day for OB emergencies.  Encounter for supervision of normal first pregnancy in second trimester  Heartburn during pregnancy in third trimester - Plan: pantoprazole (PROTONIX) 40 MG tablet   2. Heartburn during pregnancy in third trimester --Taking Tums daily, not working. Has Rx for Pepcid but it is not available due to shortage with Zantac recall. - pantoprazole (PROTONIX) 40 MG tablet; Take 1 tablet (40 mg total) by mouth daily.  Dispense: 30 tablet; Refill: 5   Preterm labor symptoms and general obstetric precautions including but not limited to vaginal bleeding, contractions, leaking of fluid and fetal movement were reviewed in detail with the patient. I discussed the assessment and treatment plan with the patient. The patient was provided an opportunity to ask questions and all were answered. The patient agreed with the plan and demonstrated an understanding of the instructions. The patient was advised to call back or seek an in-person office evaluation/go to [facility] at [facility] for any urgent or concerning symptoms. Please refer to After Visit Summary for other counseling recommendations.   I provided 10 minutes of face-to-face via WebEx time during this encounter.  No follow-ups on file.  Future Appointments  Date Time Provider Department Center  03/19/2019  8:45 AM WH-MFC [place] 2 WH-MFCUS MFC-US    Sharen Counter, CNM [facility] [employer], [facility]

## 2019-03-19 ENCOUNTER — Other Ambulatory Visit (HOSPITAL_COMMUNITY): Payer: Self-pay | Admitting: *Deleted

## 2019-03-19 ENCOUNTER — Other Ambulatory Visit: Payer: Self-pay

## 2019-03-19 ENCOUNTER — Ambulatory Visit (HOSPITAL_COMMUNITY)
Admission: RE | Admit: 2019-03-19 | Discharge: 2019-03-19 | Disposition: A | Discharge status: Home / Self Care | Payer: BLUE CROSS/BLUE SHIELD | Source: Ambulatory Visit | Attending: Obstetrics and Gynecology | Admitting: Obstetrics and Gynecology

## 2019-03-19 DIAGNOSIS — Z362 Encounter for other antenatal screening follow-up: Secondary | ICD-10-CM | POA: Insufficient documentation

## 2019-03-19 DIAGNOSIS — O99332 Smoking (tobacco) complicating pregnancy, second trimester: Secondary | ICD-10-CM | POA: Diagnosis not present

## 2019-03-19 DIAGNOSIS — Z3A24 24 weeks gestation of pregnancy: Secondary | ICD-10-CM | POA: Diagnosis not present

## 2019-03-19 DIAGNOSIS — O99333 Smoking (tobacco) complicating pregnancy, third trimester: Secondary | ICD-10-CM

## 2019-04-17 ENCOUNTER — Ambulatory Visit (INDEPENDENT_AMBULATORY_CARE_PROVIDER_SITE_OTHER): Payer: BC Managed Care – PPO | Admitting: Advanced Practice Midwife

## 2019-04-17 ENCOUNTER — Other Ambulatory Visit: Payer: Self-pay

## 2019-04-17 VITALS — BP 112/66 | HR 98 | Wt 213.0 lb

## 2019-04-17 DIAGNOSIS — Z3403 Encounter for supervision of normal first pregnancy, third trimester: Secondary | ICD-10-CM

## 2019-04-17 DIAGNOSIS — Z3A28 28 weeks gestation of pregnancy: Secondary | ICD-10-CM

## 2019-04-17 DIAGNOSIS — Z23 Encounter for immunization: Secondary | ICD-10-CM | POA: Diagnosis not present

## 2019-04-17 DIAGNOSIS — Z3009 Encounter for other general counseling and advice on contraception: Secondary | ICD-10-CM

## 2019-04-17 DIAGNOSIS — Z3480 Encounter for supervision of other normal pregnancy, unspecified trimester: Secondary | ICD-10-CM

## 2019-04-17 NOTE — Progress Notes (Signed)
   PRENATAL VISIT NOTE  Subjective:  Tonya Smith is a 23 y.o. G1P0000 at [redacted]w[redacted]d being seen today for ongoing prenatal care.  She is currently monitored for the following issues for this low-risk pregnancy and has Generalized anxiety disorder; [place]chmerz; and Supervision of low-risk first pregnancy on their problem list.  Patient reports no complaints.  Contractions: Not present. Vag. Bleeding: None.  Movement: Present. Denies leaking of fluid.   The following portions of the patient's history were reviewed and updated as appropriate: allergies, current medications, past family history, past medical history, past social history, past surgical history and problem list.   Objective:   Vitals:   04/17/19 0814  BP: 112/66  Pulse: 98  Weight: 213 lb (96.6 kg)    Fetal Status:     Movement: Present     General:  Alert, oriented and cooperative. Patient is in no acute distress.  Skin: Skin is warm and dry. No rash noted.   Cardiovascular: Normal heart rate noted  Respiratory: Normal respiratory effort, no problems with respiration noted  Abdomen: Soft, gravid, appropriate for gestational age.  Pain/Pressure: Absent     Pelvic: Cervical exam deferred        Extremities: Normal range of motion.  Edema: None  Mental Status: Normal mood and affect. Normal behavior. Normal judgment and thought content.   Assessment and Plan:  Pregnancy: G1P0000 at [redacted]w[redacted]d 1. Supervision of other normal pregnancy, antepartum --Anticipatory guidance about next visits/weeks of pregnancy given. --Reviewed safety, visitor policy, reassurance about COVID-19 for pregnancy at this time. Discussed possible changes to visits, including televisits, that may occur due to COVID-19.  The office remains open if pt needs to be seen and [facility] is open 24 hours/day for OB emergencies.   - HIV antibody (with reflex) - CBC - RPR - 2Hr GTT w/ 1 Hr Carpenter 75 g - Tdap vaccine greater than or equal to 7yo IM   2. Encounter  for counseling regarding contraception Discussed LARCs as most effective forms of birth control.  Discussed benefits/risks of other methods.  Pt desires IUD at her postpartum visit in the office.     Preterm labor symptoms and general obstetric precautions including but not limited to vaginal bleeding, contractions, leaking of fluid and fetal movement were reviewed in detail with the patient. Please refer to After Visit Summary for other counseling recommendations.   Return in about 4 weeks (around 05/15/2019).  Future Appointments  Date Time Provider [place]  05/14/2019  7:45 AM [facility] [place] 2 [facility]  05/15/2019  9:35 AM Left[place]-Kirby, Kathie Dike, CNM [facility]    Fatima Blank, CNM

## 2019-04-17 NOTE — Patient Instructions (Signed)
Third Trimester of Pregnancy The third trimester is from week 28 through week 40 (months 7 through 9). The third trimester is a time when the unborn baby (fetus) is growing rapidly. At the end of the ninth month, the fetus is about 20 inches in length and weighs 6-10 pounds. Body changes during your third trimester Your body will continue to go through many changes during pregnancy. The changes vary from woman to woman. During the third trimester:  Your weight will continue to increase. You can expect to gain 25-35 pounds (11-16 kg) by the end of the pregnancy.  You may begin to get stretch marks on your hips, abdomen, and breasts.  You may urinate more often because the fetus is moving lower into your pelvis and pressing on your bladder.  You may develop or continue to have heartburn. This is caused by increased hormones that slow down muscles in the digestive tract.  You may develop or continue to have constipation because increased hormones slow digestion and cause the muscles that push waste through your intestines to relax.  You may develop hemorrhoids. These are swollen veins (varicose veins) in the rectum that can itch or be painful.  You may develop swollen, bulging veins (varicose veins) in your legs.  You may have increased body aches in the pelvis, back, or thighs. This is due to weight gain and increased hormones that are relaxing your joints.  You may have changes in your hair. These can include thickening of your hair, rapid growth, and changes in texture. Some women also have hair loss during or after pregnancy, or hair that feels dry or thin. Your hair will most likely return to normal after your baby is born.  Your breasts will continue to grow and they will continue to become tender. A yellow fluid (colostrum) may leak from your breasts. This is the first milk you are producing for your baby.  Your belly button may stick out.  You may notice more swelling in your hands,  face, or ankles.  You may have increased tingling or numbness in your hands, arms, and legs. The skin on your belly may also feel numb.  You may feel short of breath because of your expanding uterus.  You may have more problems sleeping. This can be caused by the size of your belly, increased need to urinate, and an increase in your body's metabolism.  You may notice the fetus "dropping," or moving lower in your abdomen (lightening).  You may have increased vaginal discharge.  You may notice your joints feel loose and you may have pain around your pelvic bone. What to expect at prenatal visits You will have prenatal exams every 2 weeks until week 36. Then you will have weekly prenatal exams. During a routine prenatal visit:  You will be weighed to make sure you and the baby are growing normally.  Your blood pressure will be taken.  Your abdomen will be measured to track your baby's growth.  The fetal heartbeat will be listened to.  Any test results from the previous visit will be discussed.  You may have a cervical check near your due date to see if your cervix has softened or thinned (effaced).  You will be tested for Group B streptococcus. This happens between 35 and 37 weeks. Your health care provider may ask you:  What your birth plan is.  How you are feeling.  If you are feeling the baby move.  If you have had any abnormal   symptoms, such as leaking fluid, bleeding, severe headaches, or abdominal cramping.  If you are using any tobacco products, including cigarettes, chewing tobacco, and electronic cigarettes.  If you have any questions. Other tests or screenings that may be performed during your third trimester include:  Blood tests that check for low iron levels (anemia).  Fetal testing to check the health, activity level, and growth of the fetus. Testing is done if you have certain medical conditions or if there are problems during the pregnancy.  Nonstress test  (NST). This test checks the health of your baby to make sure there are no signs of problems, such as the baby not getting enough oxygen. During this test, a belt is placed around your belly. The baby is made to move, and its heart rate is monitored during movement. What is false labor? False labor is a condition in which you feel small, irregular tightenings of the muscles in the womb (contractions) that usually go away with rest, changing position, or drinking water. These are called Braxton Hicks contractions. Contractions may last for hours, days, or even weeks before true labor sets in. If contractions come at regular intervals, become more frequent, increase in intensity, or become painful, you should see your health care provider. What are the signs of labor?  Abdominal cramps.  Regular contractions that start at 10 minutes apart and become stronger and more frequent with time.  Contractions that start on the top of the uterus and spread down to the lower abdomen and back.  Increased pelvic pressure and dull back pain.  A watery or bloody mucus discharge that comes from the vagina.  Leaking of amniotic fluid. This is also known as your "water breaking." It could be a slow trickle or a gush. Let your health care provider know if it has a color or strange odor. If you have any of these signs, call your health care provider right away, even if it is before your due date. Follow these instructions at home: Medicines  Follow your health care provider's instructions regarding medicine use. Specific medicines may be either safe or unsafe to take during pregnancy.  Take a prenatal vitamin that contains at least 600 micrograms (mcg) of folic acid.  If you develop constipation, try taking a stool softener if your health care provider approves. Eating and drinking   Eat a balanced diet that includes fresh fruits and vegetables, whole grains, good sources of protein such as meat, eggs, or tofu,  and low-fat dairy. Your health care provider will help you determine the amount of weight gain that is right for you.  Avoid raw meat and uncooked cheese. These carry germs that can cause birth defects in the baby.  If you have low calcium intake from food, talk to your health care provider about whether you should take a daily calcium supplement.  Eat four or five small meals rather than three large meals a day.  Limit foods that are high in fat and processed sugars, such as fried and sweet foods.  To prevent constipation: ? Drink enough fluid to keep your urine clear or pale yellow. ? Eat foods that are high in fiber, such as fresh fruits and vegetables, whole grains, and beans. Activity  Exercise only as directed by your health care provider. Most women can continue their usual exercise routine during pregnancy. Try to exercise for 30 minutes at least 5 days a week. Stop exercising if you experience uterine contractions.  Avoid heavy lifting.  Do   not exercise in extreme heat or humidity, or at high altitudes.  Wear low-heel, comfortable shoes.  Practice good posture.  You may continue to have sex unless your health care provider tells you otherwise. Relieving pain and discomfort  Take frequent breaks and rest with your legs elevated if you have leg cramps or low back pain.  Take warm sitz baths to soothe any pain or discomfort caused by hemorrhoids. Use hemorrhoid cream if your health care provider approves.  Wear a good support bra to prevent discomfort from breast tenderness.  If you develop varicose veins: ? Wear support pantyhose or compression stockings as told by your healthcare provider. ? Elevate your feet for 15 minutes, 3-4 times a day. Prenatal care  Write down your questions. Take them to your prenatal visits.  Keep all your prenatal visits as told by your health care provider. This is important. Safety  Wear your seat belt at all times when driving.  Make  a list of emergency phone numbers, including numbers for family, friends, the hospital, and police and fire departments. General instructions  Avoid cat litter boxes and soil used by cats. These carry germs that can cause birth defects in the baby. If you have a cat, ask someone to clean the litter box for you.  Do not travel far distances unless it is absolutely necessary and only with the approval of your health care provider.  Do not use hot tubs, steam rooms, or saunas.  Do not drink alcohol.  Do not use any products that contain nicotine or tobacco, such as cigarettes and e-cigarettes. If you need help quitting, ask your health care provider.  Do not use any medicinal herbs or unprescribed drugs. These chemicals affect the formation and growth of the baby.  Do not douche or use tampons or scented sanitary pads.  Do not cross your legs for long periods of time.  To prepare for the arrival of your baby: ? Take prenatal classes to understand, practice, and ask questions about labor and delivery. ? Make a trial run to the hospital. ? Visit the hospital and tour the maternity area. ? Arrange for maternity or paternity leave through employers. ? Arrange for family and friends to take care of pets while you are in the hospital. ? Purchase a rear-facing car seat and make sure you know how to install it in your car. ? Pack your hospital bag. ? Prepare the baby's nursery. Make sure to remove all pillows and stuffed animals from the baby's crib to prevent suffocation.  Visit your dentist if you have not gone during your pregnancy. Use a soft toothbrush to brush your teeth and be gentle when you floss. Contact a health care provider if:  You are unsure if you are in labor or if your water has broken.  You become dizzy.  You have mild pelvic cramps, pelvic pressure, or nagging pain in your abdominal area.  You have lower back pain.  You have persistent nausea, vomiting, or  diarrhea.  You have an unusual or bad smelling vaginal discharge.  You have pain when you urinate. Get help right away if:  Your water breaks before 37 weeks.  You have regular contractions less than 5 minutes apart before 37 weeks.  You have a fever.  You are leaking fluid from your vagina.  You have spotting or bleeding from your vagina.  You have severe abdominal pain or cramping.  You have rapid weight loss or weight gain.  You have   shortness of breath with chest pain.  You notice sudden or extreme swelling of your face, hands, ankles, feet, or legs.  Your baby makes fewer than 10 movements in 2 hours.  You have severe headaches that do not go away when you take medicine.  You have vision changes. Summary  The third trimester is from week 28 through week 40, months 7 through 9. The third trimester is a time when the unborn baby (fetus) is growing rapidly.  During the third trimester, your discomfort may increase as you and your baby continue to gain weight. You may have abdominal, leg, and back pain, sleeping problems, and an increased need to urinate.  During the third trimester your breasts will keep growing and they will continue to become tender. A yellow fluid (colostrum) may leak from your breasts. This is the first milk you are producing for your baby.  False labor is a condition in which you feel small, irregular tightenings of the muscles in the womb (contractions) that eventually go away. These are called Braxton Hicks contractions. Contractions may last for hours, days, or even weeks before true labor sets in.  Signs of labor can include: abdominal cramps; regular contractions that start at 10 minutes apart and become stronger and more frequent with time; watery or bloody mucus discharge that comes from the vagina; increased pelvic pressure and dull back pain; and leaking of amniotic fluid. This information is not intended to replace advice given to you by your  health care provider. Make sure you discuss any questions you have with your health care provider. Document Released: 10/12/2001 Document Revised: 11/23/2016 Document Reviewed: 11/23/2016 [employer] Interactive Patient Education  2019 [employer]  

## 2019-04-18 LAB — 2HR GTT W 1 HR, CARPENTER, 75 G
Glucose, 1 Hr, Gest: 176 mg/dL (ref 65–179)
Glucose, 2 Hr, Gest: 101 mg/dL (ref 65–152)
Glucose, Fasting, Gest: 74 mg/dL (ref 65–91)

## 2019-04-18 LAB — CBC
HCT: 35.3 % (ref 35.0–45.0)
Hemoglobin: 12.2 g/dL (ref 11.7–15.5)
MCH: 31.9 pg (ref 27.0–33.0)
MCHC: 34.6 g/dL (ref 32.0–36.0)
MCV: 92.4 fL (ref 80.0–100.0)
MPV: 12.4 fL (ref 7.5–12.5)
Platelets: 206 10*3/uL (ref 140–400)
RBC: 3.82 10*6/uL (ref 3.80–5.10)
RDW: 12.5 % (ref 11.0–15.0)
WBC: 15.7 10*3/uL — ABNORMAL HIGH (ref 3.8–10.8)

## 2019-04-18 LAB — HIV ANTIBODY (ROUTINE TESTING W REFLEX): HIV 1&2 Ab, 4th Generation: NONREACTIVE

## 2019-04-18 LAB — RPR: RPR Ser Ql: NONREACTIVE

## 2019-05-14 ENCOUNTER — Ambulatory Visit (HOSPITAL_COMMUNITY)
Admission: RE | Admit: 2019-05-14 | Discharge: 2019-05-14 | Disposition: A | Discharge status: Home / Self Care | Payer: BC Managed Care – PPO | Source: Ambulatory Visit | Attending: Obstetrics and Gynecology | Admitting: Obstetrics and Gynecology

## 2019-05-14 ENCOUNTER — Other Ambulatory Visit: Payer: Self-pay

## 2019-05-14 ENCOUNTER — Other Ambulatory Visit (HOSPITAL_COMMUNITY): Payer: Self-pay | Admitting: *Deleted

## 2019-05-14 DIAGNOSIS — Z362 Encounter for other antenatal screening follow-up: Secondary | ICD-10-CM

## 2019-05-14 DIAGNOSIS — O99333 Smoking (tobacco) complicating pregnancy, third trimester: Secondary | ICD-10-CM | POA: Diagnosis present

## 2019-05-14 DIAGNOSIS — O99332 Smoking (tobacco) complicating pregnancy, second trimester: Secondary | ICD-10-CM | POA: Diagnosis not present

## 2019-05-14 DIAGNOSIS — O99210 Obesity complicating pregnancy, unspecified trimester: Secondary | ICD-10-CM

## 2019-05-14 DIAGNOSIS — Z3A32 32 weeks gestation of pregnancy: Secondary | ICD-10-CM | POA: Diagnosis not present

## 2019-05-15 ENCOUNTER — Encounter: Payer: Self-pay | Admitting: Advanced Practice Midwife

## 2019-05-15 ENCOUNTER — Telehealth (INDEPENDENT_AMBULATORY_CARE_PROVIDER_SITE_OTHER): Payer: BC Managed Care – PPO | Admitting: Advanced Practice Midwife

## 2019-05-15 DIAGNOSIS — Z3A32 32 weeks gestation of pregnancy: Secondary | ICD-10-CM

## 2019-05-15 DIAGNOSIS — O3663X1 Maternal care for excessive fetal growth, third trimester, fetus 1: Secondary | ICD-10-CM

## 2019-05-15 DIAGNOSIS — Z3480 Encounter for supervision of other normal pregnancy, unspecified trimester: Secondary | ICD-10-CM

## 2019-05-15 NOTE — Progress Notes (Signed)
TELEHEALTH OBSTETRICS PRENATAL VIRTUAL VIDEO VISIT ENCOUNTER NOTE  Provider location: [facility] [place]   I connected with Tonya Smith on 05/15/19 at  9:35 AM EDT by WebEx Video Encounter at home and verified that I am speaking with the correct person using two identifiers.   I discussed the limitations, risks, security and privacy concerns of performing an evaluation and management service virtually and the availability of in person appointments. I also discussed with the patient that there may be a patient responsible charge related to this service. The patient expressed understanding and agreed to proceed. Subjective:  Tonya Smith is a 23 y.o. G1P0000 at [redacted]w[redacted]d being seen today for ongoing prenatal care.  She is currently monitored for the following issues for this low-risk pregnancy and has Generalized anxiety disorder; Mittelschmerz; and Supervision of low-risk first pregnancy on their problem list.  Patient reports no complaints.  Contractions: Not present. Vag. Bleeding: None.  Movement: Present. Denies any leaking of fluid.   The following portions of the patient's history were reviewed and updated as appropriate: allergies, current medications, past family history, past medical history, past social history, past surgical history and problem list.   Objective:  There were no vitals filed for this visit.  Fetal Status:     Movement: Present     General:  Alert, oriented and cooperative. Patient is in no acute distress.  Respiratory: Normal respiratory effort, no problems with respiration noted  Mental Status: Normal mood and affect. Normal behavior. Normal judgment and thought content.  Rest of physical exam deferred due to type of encounter  Imaging: [place] Mfm Ob Follow Up  Result Date: 05/14/2019 ----------------------------------------------------------------------  OBSTETRICS REPORT                       (Signed Final 05/14/2019 08:44 am)  ---------------------------------------------------------------------- Patient Info  ID #:       [id number]                          D.O.B.:  1996-08-14 (23 yrs)  Name:       Tonya Smith                  Visit Date: 05/14/2019 08:02 am ---------------------------------------------------------------------- Performed By  Performed By:     Marcellina Millin          Ref. Address:     Faculty                    RDMS  Attending:        Noralee Space MD        [facility]  Referred By:      Catalina Antigua MD ---------------------------------------------------------------------- Orders   #  Description                          Code  Ordered By   1  US MFM OB FOLLOW UP                  [id number]     Noralee SpaceAVI SHANKAR  ----------------------------------------------------------------------   #  Order #                    Accession #                 Episode #   1  [id number]                  [id number]                  [id number]  ---------------------------------------------------------------------- Indications   Antenatal follow-up for nonvisualized fetal    Z36.2   anatomy   [redacted] weeks gestation of pregnancy                Z3A.32   Tobacco use complicating pregnancy,            O99.332   second trimester( 1 ppd)  ---------------------------------------------------------------------- Vital Signs                                                 Height:        5'6" ---------------------------------------------------------------------- Fetal Evaluation  Num Of Fetuses:         1  Fetal Heart Rate(bpm):  171  Cardiac Activity:       Observed  Presentation:           Cephalic  Placenta:               Posterior  P. Cord Insertion:      Previously Visualized  Amniotic Fluid  AFI FV:      Within normal limits  AFI Sum(cm)     %Tile       Largest Pocket(cm)  15.33           54          6.73  RUQ(cm)       RLQ(cm)       LUQ(cm)         LLQ(cm)  6.73          2.31          3.09           3.2 ---------------------------------------------------------------------- Biometry  BPD:      88.3  mm     G. Age:  35w 5d       > 99  %    CI:        76.69   %    70 - 86                                                          FL/HC:      20.3   %    19.1 - 21.3  HC:      319.4  mm     G. Age:  36w 0d         95  %    HC/AC:      1.08  0.96 - 1.17  AC:      296.3  mm     G. Age:  33w 4d         84  %    FL/BPD:     73.5   %    71 - 87  FL:       64.9  mm     G. Age:  33w 3d         70  %    FL/AC:      21.9   %    20 - 24  Est. FW:    2328  gm      5 lb 2 oz     89  % ---------------------------------------------------------------------- OB History  Gravidity:    1 ---------------------------------------------------------------------- Gestational Age  LMP:           32w 2d        Date:  09/30/18                 EDD:   07/07/19  U/S Today:     34w 5d                                        EDD:   06/20/19  Best:          32w 2d     Det. By:  LMP  (09/30/18)          EDD:   07/07/19 ---------------------------------------------------------------------- Anatomy  Cranium:               Appears normal         Aortic Arch:            Appears normal  Cavum:                 Appears normal         Ductal Arch:            Appears normal  Ventricles:            Appears normal         Diaphragm:              Appears normal  Choroid Plexus:        Previously seen        Stomach:                Appears normal, left                                                                        sided  Cerebellum:            Appears normal         Abdomen:                Appears normal  Posterior Fossa:       Appears normal         Abdominal Wall:         Previously seen  Nuchal Fold:           Appears normal  Cord Vessels:           Previously seen  Face:                  Not well visualized    Kidneys:                Appear normal  Lips:                  Previously  seen        Bladder:                Appears normal  Thoracic:              Appears normal         Spine:                  Previously seen  Heart:                 Not well visualized    Upper Extremities:      Previously seen  RVOT:                  Appears normal         Lower Extremities:      Previously seen  LVOT:                  Appears normal  Other:  Female gender. Technically difficult due to maternal habitus and fetal          position. ---------------------------------------------------------------------- Cervix Uterus Adnexa  Cervix  Not visualized (advanced GA >24wks) ---------------------------------------------------------------------- Impression  Fetal growth is appropriate for gestational age. The estimated  fetal weight is at the 89th percentile. Amniotic fluid is normal  and good fetal activity is seen.  Patient returned for fetal growth assessment. Fetal growth is  appropriate for gestational age. Amniotic fluid is normal and  good fetal activity is seen.  A careful evaluation of skull to rule out craniosynostosis was  made because of appearance of indentation in some views.  No evidence of craniosynostosis. The forehead appears  normal.  I reassured the patient of what we consider a normal finding.  Patient does not have gestational diabetes. ---------------------------------------------------------------------- Recommendations  An appointment was made for her to return in 4 weeks for  fetal growth assessment (maternal obesity). ----------------------------------------------------------------------                  Noralee Spaceavi Shankar, MD Electronically Signed Final Report   05/14/2019 08:44 am ----------------------------------------------------------------------   Assessment and Plan:  Pregnancy: G1P0000 at 6811w3d 1. Large for gestational age fetus affecting management of mother, antepartum, third trimester, fetus 1 --MFM US on 05/14/19 with EFW 2328g, 5lb 2 oz, or 89%tile --Discussed LGA with pt  at today's visit.  Pt not GDM with normal 2 hour.  One hour value is high normal at 176, other values well within normal.  Recommend healthy diet, consider following ADA with foods lower on glycemic index. Large size is associated with delivery complications including shoulder dystocia and increased risk of C/S but US is an estimate only.  At 5500 g a primary C/S is offered but less than that, the labor curve is watched closely and with normal progress, no changes in plan of care. Pt states understanding.  F/U US in 4 weeks.    2. Supervision of other normal pregnancy, antepartum --Anticipatory guidance about next visits/weeks of pregnancy given. --Reviewed safety, visitor policy, reassurance about COVID-19 for  pregnancy at this time. Discussed possible changes to visits, including televisits, that may occur due to COVID-19.  The office remains open if pt needs to be seen and MAU is open 24 hours/day for OB emergencies.      Preterm labor symptoms and general obstetric precautions including but not limited to vaginal bleeding, contractions, leaking of fluid and fetal movement were reviewed in detail with the patient. I discussed the assessment and treatment plan with the patient. The patient was provided an opportunity to ask questions and all were answered. The patient agreed with the plan and demonstrated an understanding of the instructions. The patient was advised to call back or seek an in-person office evaluation/go to MAU at [facility] for any urgent or concerning symptoms. Please refer to After Visit Summary for other counseling recommendations.   I provided 10 minutes of face-to-face time during this encounter.  No follow-ups on file.  Future Appointments  Date Time Provider [place]  06/11/2019  8:45 AM [place] [facility]-US  06/11/2019  8:45 AM [place] 2 WH-MFCUS MFC-US    Fatima Blank, [place] for [employer], [place]  [facility]

## 2019-05-15 NOTE — Progress Notes (Signed)
PT doesn't have BP cuff with her. BP at U/S appt yesterday was 133/82

## 2019-06-11 ENCOUNTER — Other Ambulatory Visit: Payer: Self-pay

## 2019-06-11 ENCOUNTER — Encounter (HOSPITAL_COMMUNITY): Payer: Self-pay

## 2019-06-11 ENCOUNTER — Ambulatory Visit (HOSPITAL_COMMUNITY): Payer: BC Managed Care – PPO | Admitting: *Deleted

## 2019-06-11 ENCOUNTER — Ambulatory Visit (HOSPITAL_COMMUNITY)
Admission: RE | Admit: 2019-06-11 | Discharge: 2019-06-11 | Disposition: A | Discharge status: Home / Self Care | Payer: BC Managed Care – PPO | Source: Ambulatory Visit | Attending: Obstetrics and Gynecology | Admitting: Obstetrics and Gynecology

## 2019-06-11 VITALS — BP 129/82 | HR 97 | Temp 98.5°F

## 2019-06-11 DIAGNOSIS — O0990 Supervision of high risk pregnancy, unspecified, unspecified trimester: Secondary | ICD-10-CM

## 2019-06-11 DIAGNOSIS — Z3A36 36 weeks gestation of pregnancy: Secondary | ICD-10-CM | POA: Diagnosis not present

## 2019-06-11 DIAGNOSIS — O99213 Obesity complicating pregnancy, third trimester: Secondary | ICD-10-CM | POA: Diagnosis not present

## 2019-06-11 DIAGNOSIS — O26843 Uterine size-date discrepancy, third trimester: Secondary | ICD-10-CM | POA: Diagnosis not present

## 2019-06-11 DIAGNOSIS — O99210 Obesity complicating pregnancy, unspecified trimester: Secondary | ICD-10-CM | POA: Insufficient documentation

## 2019-06-13 ENCOUNTER — Other Ambulatory Visit: Payer: Self-pay

## 2019-06-13 ENCOUNTER — Ambulatory Visit (INDEPENDENT_AMBULATORY_CARE_PROVIDER_SITE_OTHER): Payer: BC Managed Care – PPO | Admitting: Obstetrics and Gynecology

## 2019-06-13 VITALS — BP 106/78 | HR 82 | Wt 223.0 lb

## 2019-06-13 DIAGNOSIS — Z113 Encounter for screening for infections with a predominantly sexual mode of transmission: Secondary | ICD-10-CM | POA: Diagnosis not present

## 2019-06-13 DIAGNOSIS — Z3A36 36 weeks gestation of pregnancy: Secondary | ICD-10-CM

## 2019-06-13 DIAGNOSIS — Z3403 Encounter for supervision of normal first pregnancy, third trimester: Secondary | ICD-10-CM

## 2019-06-13 NOTE — Progress Notes (Signed)
   PRENATAL VISIT NOTE  Subjective:  Tonya Smith is a 23 y.o. G1P0000 at [redacted]w[redacted]d being seen today for ongoing prenatal care.  She is currently monitored for the following issues for this low-risk pregnancy and has Generalized anxiety disorder; Mittelschmerz; and Supervision of low-risk first pregnancy on their problem list.  Patient reports Pelvic pressure .  Contractions: Irregular. Vag. Bleeding: None.  Movement: Present. Denies leaking of fluid.   The following portions of the patient's history were reviewed and updated as appropriate: allergies, current medications, past family history, past medical history, past social history, past surgical history and problem list.   Objective:   Vitals:   06/13/19 0824  BP: 106/78  Pulse: 82  Weight: 223 lb (101.2 kg)    Fetal Status: Fetal Heart Rate (bpm): 145 Fundal Height: 37 cm Movement: Present  Presentation: Vertex  General:  Alert, oriented and cooperative. Patient is in no acute distress.  Skin: Skin is warm and dry. No rash noted.   Cardiovascular: Normal heart rate noted  Respiratory: Normal respiratory effort, no problems with respiration noted  Abdomen: Soft, gravid, appropriate for gestational age.  Pain/Pressure: Present     Pelvic: Cervical exam performed Dilation: 1.5 Effacement (%): Thick Station: -3  Extremities: Normal range of motion.  Edema: Trace  Mental Status: Normal mood and affect. Normal behavior. Normal judgment and thought content.   Assessment and Plan:  Pregnancy: G1P0000 at [redacted]w[redacted]d 1. Encounter for supervision of normal first pregnancy in third trimester  - Culture, beta strep (group b only) - Cervicovaginal ancillary only( [place]) - Preterm labor precautions  - [facility] to [facility] given.   Preterm labor symptoms and general obstetric precautions including but not limited to vaginal bleeding, contractions, leaking of fluid and fetal movement were reviewed in detail with the patient. Please refer to  After Visit Summary for other counseling recommendations.   Return in about 1 week (around 06/20/2019), or In person visit.Marland Kitchen  No future appointments.  Noni Saupe, NP

## 2019-06-13 NOTE — Patient Instructions (Signed)
[facility]   Address: [address] [place], [place], [place] [address]

## 2019-06-14 LAB — CERVICOVAGINAL ANCILLARY ONLY
Chlamydia: NEGATIVE
Neisseria Gonorrhea: NEGATIVE

## 2019-06-15 ENCOUNTER — Inpatient Hospital Stay (HOSPITAL_COMMUNITY): Payer: BC Managed Care – PPO | Admitting: Anesthesiology

## 2019-06-15 ENCOUNTER — Inpatient Hospital Stay (HOSPITAL_COMMUNITY)
Admission: AD | Admit: 2019-06-15 | Discharge: 2019-06-17 | DRG: 806 | Disposition: A | Discharge status: Home / Self Care | Payer: BC Managed Care – PPO | Attending: Obstetrics & Gynecology | Admitting: Obstetrics & Gynecology

## 2019-06-15 ENCOUNTER — Other Ambulatory Visit: Payer: Self-pay

## 2019-06-15 ENCOUNTER — Encounter (HOSPITAL_COMMUNITY): Payer: Self-pay | Admitting: *Deleted

## 2019-06-15 DIAGNOSIS — O42913 Preterm premature rupture of membranes, unspecified as to length of time between rupture and onset of labor, third trimester: Principal | ICD-10-CM | POA: Diagnosis present

## 2019-06-15 DIAGNOSIS — Z87891 Personal history of nicotine dependence: Secondary | ICD-10-CM

## 2019-06-15 DIAGNOSIS — O42919 Preterm premature rupture of membranes, unspecified as to length of time between rupture and onset of labor, unspecified trimester: Secondary | ICD-10-CM | POA: Diagnosis present

## 2019-06-15 DIAGNOSIS — Z720 Tobacco use: Secondary | ICD-10-CM | POA: Diagnosis present

## 2019-06-15 DIAGNOSIS — Z3A36 36 weeks gestation of pregnancy: Secondary | ICD-10-CM | POA: Diagnosis not present

## 2019-06-15 DIAGNOSIS — Z20828 Contact with and (suspected) exposure to other viral communicable diseases: Secondary | ICD-10-CM | POA: Diagnosis present

## 2019-06-15 DIAGNOSIS — Z3403 Encounter for supervision of normal first pregnancy, third trimester: Secondary | ICD-10-CM

## 2019-06-15 DIAGNOSIS — O42013 Preterm premature rupture of membranes, onset of labor within 24 hours of rupture, third trimester: Secondary | ICD-10-CM | POA: Diagnosis not present

## 2019-06-15 LAB — CBC
HCT: 37 % (ref 36.0–46.0)
Hemoglobin: 12.3 g/dL (ref 12.0–15.0)
MCH: 30.1 pg (ref 26.0–34.0)
MCHC: 33.2 g/dL (ref 30.0–36.0)
MCV: 90.5 fL (ref 80.0–100.0)
Platelets: 213 10*3/uL (ref 150–400)
RBC: 4.09 MIL/uL (ref 3.87–5.11)
RDW: 12.8 % (ref 11.5–15.5)
WBC: 15.6 10*3/uL — ABNORMAL HIGH (ref 4.0–10.5)
nRBC: 0 % (ref 0.0–0.2)

## 2019-06-15 LAB — TYPE AND SCREEN
ABO/RH(D): A POS
Antibody Screen: NEGATIVE

## 2019-06-15 LAB — RPR: RPR Ser Ql: NONREACTIVE

## 2019-06-15 LAB — SARS CORONAVIRUS 2 BY RT PCR (HOSPITAL ORDER, PERFORMED IN ~~LOC~~ HOSPITAL LAB): SARS Coronavirus 2: NEGATIVE

## 2019-06-15 LAB — POCT FERN TEST: POCT Fern Test: POSITIVE

## 2019-06-15 LAB — GROUP B STREP BY PCR: Group B strep by PCR: NEGATIVE

## 2019-06-15 MED ORDER — ONDANSETRON HCL 4 MG/2ML IJ SOLN: 4.0000 mg | Freq: Four times a day (QID) | INTRAMUSCULAR | Status: DC | PRN

## 2019-06-15 MED ORDER — SIMETHICONE 80 MG PO CHEW
80.0000 mg | CHEWABLE_TABLET | ORAL | Status: DC | PRN
  Administered 2019-06-16: 80 mg via ORAL
  Filled 2019-06-15: qty 1

## 2019-06-15 MED ORDER — WITCH HAZEL-GLYCERIN EX PADS: 1.0000 "application " | MEDICATED_PAD | CUTANEOUS | Status: DC | PRN

## 2019-06-15 MED ORDER — LACTATED RINGERS IV SOLN
500.0000 mL | Freq: Once | INTRAVENOUS | Status: AC
  Administered 2019-06-15: 500 mL via INTRAVENOUS

## 2019-06-15 MED ORDER — EPHEDRINE 5 MG/ML INJ: 10.0000 mg | INTRAVENOUS | Status: DC | PRN

## 2019-06-15 MED ORDER — ONDANSETRON HCL 4 MG PO TABS: 4.0000 mg | ORAL_TABLET | ORAL | Status: DC | PRN

## 2019-06-15 MED ORDER — FENTANYL-BUPIVACAINE-NACL 0.5-0.125-0.9 MG/250ML-% EP SOLN: 12.0000 mL/h | EPIDURAL | Status: DC | PRN

## 2019-06-15 MED ORDER — TETANUS-DIPHTH-ACELL PERTUSSIS 5-2.5-18.5 LF-MCG/0.5 IM SUSP: 0.5000 mL | Freq: Once | INTRAMUSCULAR | Status: DC

## 2019-06-15 MED ORDER — OXYCODONE-ACETAMINOPHEN 5-325 MG PO TABS: 2.0000 | ORAL_TABLET | ORAL | Status: DC | PRN

## 2019-06-15 MED ORDER — OXYTOCIN 40 UNITS IN NORMAL SALINE INFUSION - SIMPLE MED
2.5000 [IU]/h | INTRAVENOUS | Status: DC
  Administered 2019-06-15: 2.5 [IU]/h via INTRAVENOUS
  Filled 2019-06-15: qty 1000

## 2019-06-15 MED ORDER — LACTATED RINGERS IV SOLN: 500.0000 mL | INTRAVENOUS | Status: DC | PRN

## 2019-06-15 MED ORDER — SODIUM CHLORIDE (PF) 0.9 % IJ SOLN
INTRAMUSCULAR | Status: DC | PRN
  Administered 2019-06-15: 12 mL/h via EPIDURAL

## 2019-06-15 MED ORDER — ONDANSETRON HCL 4 MG/2ML IJ SOLN: 4.0000 mg | INTRAMUSCULAR | Status: DC | PRN

## 2019-06-15 MED ORDER — ACETAMINOPHEN 325 MG PO TABS: 650.0000 mg | ORAL_TABLET | ORAL | Status: DC | PRN

## 2019-06-15 MED ORDER — PHENYLEPHRINE 40 MCG/ML (10ML) SYRINGE FOR IV PUSH (FOR BLOOD PRESSURE SUPPORT): 80.0000 ug | PREFILLED_SYRINGE | INTRAVENOUS | Status: DC | PRN

## 2019-06-15 MED ORDER — COCONUT OIL OIL
1.0000 "application " | TOPICAL_OIL | Status: DC | PRN
  Administered 2019-06-16: 1 via TOPICAL

## 2019-06-15 MED ORDER — DIPHENHYDRAMINE HCL 50 MG/ML IJ SOLN: 12.5000 mg | INTRAMUSCULAR | Status: DC | PRN

## 2019-06-15 MED ORDER — OXYCODONE-ACETAMINOPHEN 5-325 MG PO TABS: 1.0000 | ORAL_TABLET | ORAL | Status: DC | PRN

## 2019-06-15 MED ORDER — MISOPROSTOL 50MCG HALF TABLET
50.0000 ug | ORAL_TABLET | ORAL | Status: DC | PRN
  Administered 2019-06-15 (×2): 50 ug via ORAL
  Filled 2019-06-15 (×2): qty 1

## 2019-06-15 MED ORDER — OXYTOCIN BOLUS FROM INFUSION
500.0000 mL | Freq: Once | INTRAVENOUS | Status: AC
  Administered 2019-06-15: 500 mL via INTRAVENOUS

## 2019-06-15 MED ORDER — DIPHENHYDRAMINE HCL 25 MG PO CAPS: 25.0000 mg | ORAL_CAPSULE | Freq: Four times a day (QID) | ORAL | Status: DC | PRN

## 2019-06-15 MED ORDER — TERBUTALINE SULFATE 1 MG/ML IJ SOLN: 0.2500 mg | Freq: Once | INTRAMUSCULAR | Status: DC | PRN

## 2019-06-15 MED ORDER — ZOLPIDEM TARTRATE 5 MG PO TABS: 5.0000 mg | ORAL_TABLET | Freq: Every evening | ORAL | Status: DC | PRN

## 2019-06-15 MED ORDER — FLEET ENEMA 7-19 GM/118ML RE ENEM: 1.0000 | ENEMA | RECTAL | Status: DC | PRN

## 2019-06-15 MED ORDER — OXYTOCIN 40 UNITS IN NORMAL SALINE INFUSION - SIMPLE MED
1.0000 m[IU]/min | INTRAVENOUS | Status: DC
  Administered 2019-06-15: 2 m[IU]/min via INTRAVENOUS

## 2019-06-15 MED ORDER — FENTANYL CITRATE (PF) 100 MCG/2ML IJ SOLN
50.0000 ug | INTRAMUSCULAR | Status: DC | PRN
  Administered 2019-06-15: 100 ug via INTRAVENOUS
  Filled 2019-06-15: qty 2

## 2019-06-15 MED ORDER — IBUPROFEN 600 MG PO TABS
600.0000 mg | ORAL_TABLET | Freq: Four times a day (QID) | ORAL | Status: DC
  Administered 2019-06-16 – 2019-06-17 (×8): 600 mg via ORAL
  Filled 2019-06-15 (×8): qty 1

## 2019-06-15 MED ORDER — SOD CITRATE-CITRIC ACID 500-334 MG/5ML PO SOLN: 30.0000 mL | ORAL | Status: DC | PRN

## 2019-06-15 MED ORDER — SENNOSIDES-DOCUSATE SODIUM 8.6-50 MG PO TABS
2.0000 | ORAL_TABLET | ORAL | Status: DC
  Administered 2019-06-16: 2 via ORAL
  Filled 2019-06-15: qty 2

## 2019-06-15 MED ORDER — LIDOCAINE HCL (PF) 1 % IJ SOLN
INTRAMUSCULAR | Status: DC | PRN
  Administered 2019-06-15: 11 mL via EPIDURAL

## 2019-06-15 MED ORDER — HYDROXYZINE HCL 50 MG PO TABS: 50.0000 mg | ORAL_TABLET | Freq: Four times a day (QID) | ORAL | Status: DC | PRN

## 2019-06-15 MED ORDER — BENZOCAINE-MENTHOL 20-0.5 % EX AERO
1.0000 "application " | INHALATION_SPRAY | CUTANEOUS | Status: DC | PRN
  Administered 2019-06-16: 1 via TOPICAL
  Filled 2019-06-15: qty 56

## 2019-06-15 MED ORDER — LACTATED RINGERS IV SOLN
INTRAVENOUS | Status: DC
  Administered 2019-06-15 (×3): via INTRAVENOUS

## 2019-06-15 MED ORDER — LIDOCAINE HCL (PF) 1 % IJ SOLN: 30.0000 mL | INTRAMUSCULAR | Status: DC | PRN

## 2019-06-15 MED ORDER — FENTANYL-BUPIVACAINE-NACL 0.5-0.125-0.9 MG/250ML-% EP SOLN
EPIDURAL | Status: AC
  Filled 2019-06-15: qty 250

## 2019-06-15 MED ORDER — PRENATAL MULTIVITAMIN CH
1.0000 | ORAL_TABLET | Freq: Every day | ORAL | Status: DC
  Administered 2019-06-16 – 2019-06-17 (×2): 1 via ORAL
  Filled 2019-06-15 (×2): qty 1

## 2019-06-15 MED ORDER — DIBUCAINE (PERIANAL) 1 % EX OINT: 1.0000 "application " | TOPICAL_OINTMENT | CUTANEOUS | Status: DC | PRN

## 2019-06-15 NOTE — MAU Note (Signed)
Pt reports to MAU c/o possible SROM @ 0330 clear fluid. Pt reports irregular ctxs that are a 4/10. No bleeding. Pt reports NO FM since last night before she went to bed.

## 2019-06-15 NOTE — Progress Notes (Signed)
LABOR PROGRESS NOTE  Tonya Smith is a 23 y.o. G1P0000 at [redacted]w[redacted]d  admitted for PROM.  Subjective: She is up ambulating from the bathroom. She is experiencing moderate discomfort with contractions.  Objective: BP (!) 144/89   Pulse (!) 102   Temp 98.5 F (36.9 C) (Oral)   Resp 18   Ht 5\' 6"  (1.676 m)   Wt 102.5 kg   LMP 09/30/2018   BMI 36.46 kg/m  or  Vitals:   06/15/19 0832 06/15/19 1050 06/15/19 1200 06/15/19 1450  BP: (!) 141/91  109/88 (!) 144/89  Pulse: 89  82 (!) 102  Resp:      Temp:  97.8 F (36.6 C) 98.2 F (36.8 C) 98.5 F (36.9 C)  TempSrc:  Oral Oral Oral  Weight:      Height:       Temp: 100.2 axillary  Dilation: 5 Effacement (%): 70 Station: -2 Presentation: Vertex Exam by:: Dr. Janus Molder  FHT: baseline rate 165bpm, moderate varibility, 15 x 15 acel, no decel Toco: minimal  Labs: Lab Results  Component Value Date   WBC 15.6 (H) 06/15/2019   HGB 12.3 06/15/2019   HCT 37.0 06/15/2019   MCV 90.5 06/15/2019   PLT 213 06/15/2019    Patient Active Problem List   Diagnosis Date Noted  . Indication for care in labor or delivery 06/15/2019  . Supervision of low-risk first pregnancy 12/06/2018  . 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 Generalized anxiety disorder 07/20/2017    Assessment / Plan: 23 y.o. G1P0000 at [redacted]w[redacted]d here for PROM.  Labor: Start pitocin 25mU/min. Continue time appropriate cervical exams.  Fetal Wellbeing:  Cat II due to tachycardia, maternal axillary temp 100.2. Vertex on digital exam. Continue to monitor for indication of antibiotics. Pain Control:  Maternally supported. Fentanyl 100 mcg IV. May have epidural. Anticipated MOD: Vaginal  Gerlene Fee, [place] PGY-1 06/15/2019, 4:09 PM

## 2019-06-15 NOTE — Anesthesia Preprocedure Evaluation (Signed)
Anesthesia Evaluation    Airway Mallampati: II  TM Distance: >3 FB Neck ROM: Full    Dental no notable dental hx.    Pulmonary former smoker,    Pulmonary exam normal breath sounds clear to auscultation       Cardiovascular Normal cardiovascular exam Rhythm:Regular Rate:Normal     Neuro/Psych    GI/Hepatic   Endo/Other    Renal/GU      Musculoskeletal   Abdominal (+) + obese,   Peds  Hematology   Anesthesia Other Findings   Reproductive/Obstetrics (+) Pregnancy                             Anesthesia Physical Anesthesia Plan  ASA: II  Anesthesia Plan: Epidural   Post-op Pain Management:    Induction:   PONV Risk Score and Plan:   Airway Management Planned:   Additional Equipment:   Intra-op Plan:   Post-operative Plan:   Informed Consent:   Plan Discussed with:   Anesthesia Plan Comments:         Anesthesia Quick Evaluation

## 2019-06-15 NOTE — H&P (Signed)
Tonya Smith is a 23 y.o. female G1P0000 with IUP at 6532w6d presenting for ROM at 0330. Pt states she has been having irregular, every 5-10 minutes contractions, associated with none vaginal bleeding for a few hours..  Membranes are ruptured, clear fluid, with active fetal movement.   [facility]  Prenatal History/Complications:  none  Past Medical History: Past Medical History:  Diagnosis Date  . Anxiety   . Vaginal Pap smear, abnormal     Past Surgical History: Past Surgical History:  Procedure Laterality Date  . broken leg     screws and rods in L leg  . TONSILLECTOMY    . wisdom teeth removal      Obstetrical History: OB History    Gravida  1   Para  0   Term  0   Preterm  0   AB  0   Living  0     SAB  0   TAB  0   Ectopic  0   Multiple  0   Live Births  0            Social History: Social History   Socioeconomic History  . Marital status: Single    Spouse name: Not on file  . Number of children: Not on file  . Years of education: Not on file  . Highest education level: Not on file  Occupational History  . Not on file  Social Needs  . Financial resource strain: Not on file  . Food insecurity    Worry: Not on file    Inability: Not on file  . Transportation needs    Medical: Not on file    Non-medical: Not on file  Tobacco Use  . Smoking status: Former Smoker    Packs/day: 1.00  . Smokeless tobacco: Never Used  Substance and Sexual Activity  . Alcohol use: Yes    Comment: Rarely  . Drug use: No  . Sexual activity: Yes    Birth control/protection: None  Lifestyle  . Physical activity    Days per week: Not on file    Minutes per session: Not on file  . Stress: Not on file  Relationships  . Social Musicianconnections    Talks on phone: Not on file    Gets together: Not on file    Attends religious service: Not on file    Active member of club or organization: Not on file    Attends meetings of clubs or organizations: Not on  file    Relationship status: Not on file  Other Topics Concern  . Not on file  Social History Narrative  . Not on file    Family History: Family History  Problem Relation Age of Onset  . Diabetes Father   . Hypertension Father     Allergies: No Known Allergies  Medications Prior to Admission  Medication Sig Dispense Refill Last Dose  . famotidine (PEPCID) 20 MG tablet Take 1 tablet (20 mg total) by mouth 2 (two) times daily. (Patient not taking: Reported on 04/17/2019) 60 tablet 5   . pantoprazole (PROTONIX) 40 MG tablet Take 1 tablet (40 mg total) by mouth daily. (Patient not taking: Reported on 04/17/2019) 30 tablet 5   . Prenatal Vit-Fe Fumarate-FA (PRENATAL VITAMIN PO) Take by mouth.           Review of Systems   Constitutional: Negative for fever and chills Eyes: Negative for visual disturbances Respiratory: Negative for shortness of breath, dyspnea Cardiovascular:  Negative for chest pain or palpitations  Gastrointestinal: Negative for vomiting, diarrhea and constipation.  POSITIVE for abdominal pain (contractions) Genitourinary: Negative for dysuria and urgency Musculoskeletal: Negative for back pain, joint pain, myalgias  Neurological: Negative for dizziness and headaches      Weight 102.5 kg, last menstrual period 09/30/2018. General appearance: alert, cooperative and no distress Lungs: normal respiratory effort Heart: regular rate and rhythm Abdomen: soft, non-tender; bowel sounds normal Extremities: Homans sign is negative, no sign of DVT DTR's 2+ Presentation: cephalic Fetal monitoring  Baseline: 140 bpm, Variability: Good {> 6 bpm), Accelerations: Reactive and Decelerations: Absent Uterine activity  Mild, irregular  Cx 2/60/-2   Prenatal labs: ABO, Rh: A/RH(D) POSITIVE/-- (02/05 1420) Antibody: NO ANTIBODIES DETECTED (02/05 1420) Rubella: 1.51 (02/05 1420) RPR: NON-REACTIVE (06/16 0903)  HBsAg: NON-REACTIVE (02/05 1420)  HIV: NON-REACTIVE  (06/16 0903)  GBS:      Nursing Staff Provider  Office Location [facility]  Dating  Certain LMP c/w 96Q sono  Language  English  Anatomy US  normal  Flu Vaccine  12/06/2018 Genetic Screen  NIPS:    AFP:  neg   TDaP vaccine   04/17/19 Hgb A1C or  GTT Early  Third trimester F-74 1-176 2-101 A1C: 5.3  Rhogam  A positive    LAB RESULTS   Feeding Plan Bottle, will try to breastfeed  Blood Type   A Pos   Contraception IUD Antibody  NEG  Circumcision Iyes Rubella  NR  Pediatrician   RPR NON-REACTIVE (06/12 0922)   Support Person Anitra Doxtater- Mom  HBsAg NON-REACTIVE (06/12 2297)   Prenatal Classes Info given HIV NON-REACTIVE (06/12 9892)  BTL Consent  GBS  (For PCN allergy, check sensitivities)   VBAC Consent  Pap Normal on 04/13/18    Hgb Electro      CF Negative     SMA 2    Waterbirth  [ ]  Class [ ]  Consent [ ]  CNM visit    Prenatal Transfer Tool  Maternal Diabetes: No Genetic Screening: Normal Maternal Ultrasounds/Referrals: Normal Fetal Ultrasounds or other Referrals:  None Maternal Substance Abuse:  No Significant Maternal Medications:  None Significant Maternal Lab Results: GBS pen ding     No results found for this or any previous visit (from the past 24 hour(s)).  Assessment: Tonya Smith is a 23 y.o. G1P0000 with an IUP at [redacted]w[redacted]d presenting for PROM at [place]: #Labor:foley placed and oral cytotec ordered #Pain:  Per request #FWB Cat 1 #ID: GBS: PCR in progress     Christin Fudge 06/15/2019, 4:36 AM

## 2019-06-15 NOTE — Progress Notes (Signed)
Patient ID: Tonya Smith, female   DOB: 08-04-1996, 23 y.o.   MRN: [id number]  Doing well; feeling a little crampy; cervical foley out and given 2nd dose of cytotec  T 98.2; BP 109/88, P 82 FHR 150s, decreased variability, occ mi variables Ctx irreg 4-6 mins Cx was 3-4/60/-2 at cyto placement  IUP@36 .6wks PPROM Cx semi-favorable GBS neg  Will plan for Pit after 4hrs  Myrtis Ser [facility] 06/15/2019

## 2019-06-15 NOTE — Progress Notes (Signed)
LABOR PROGRESS NOTE  Tonya Smith is a 23 y.o. G1P0000 at [redacted]w[redacted]d  admitted for PROM.  Subjective: She is doing well resting in bed. She is not experiencing any discomfort at this time.   Objective: BP (!) 141/91   Pulse 89   Temp 98 F (36.7 C) (Oral)   Resp 18   Ht 5\' 6"  (6.759 m)   Wt 102.5 kg   LMP 09/30/2018   BMI 36.46 kg/m  or  Vitals:   06/15/19 0505 06/15/19 0513 06/15/19 0741 06/15/19 0832  BP: 121/75   (!) 141/91  Pulse: (!) 106   89  Resp: 20  18   Temp: 97.6 F (36.4 C)  98 F (36.7 C)   TempSrc: Axillary  Oral   Weight:  102.5 kg    Height:  5\' 6"  (1.676 m)       Dilation: 2 Effacement (%): 60 Station: -2 Presentation: Vertex Exam by:: Manus Gunning C.D., CNM FHT: baseline rate 155bpm, minimal to moderate varibility, acels, no decel Toco: irritability  Labs: Lab Results  Component Value Date   WBC 15.6 (H) 06/15/2019   HGB 12.3 06/15/2019   HCT 37.0 06/15/2019   MCV 90.5 06/15/2019   PLT 213 06/15/2019    Patient Active Problem List   Diagnosis Date Noted  . Indication for care in labor or delivery 06/15/2019  . Supervision of low-risk first pregnancy 12/06/2018  . 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 Generalized anxiety disorder 07/20/2017    Assessment / Plan: 23 y.o. G1P0000 at [redacted]w[redacted]d here for PROM.  Labor: FB is still in place. Consider pitocin when FB is expelled. Continue time appropriate cervical exams. Fetal Wellbeing:  Cat I Pain Control:  Maternally supported Anticipated MOD: Vaginal  Gerlene Fee, DO [facility] PGY-1 06/15/2019, 10:06 AM

## 2019-06-15 NOTE — Lactation Note (Signed)
This note was copied from a baby's chart. Lactation Consultation Note  Patient Name: Tonya Smith Date: 06/15/2019 Reason for consult: Initial assessment;Early term 42-38.6wks   Family of LPTI state infant is sleepy, but fed well earlier.  LC reviewed LPTI guidelines with family.  Golinda set mom up with DEBP.  Offered to assist with BF.   Hand exp.taught to mom and dad.  Dad hand exp.   2 ml collected in spoon for finger feeding after bf.  Mom has wide spaced breasts with little breast tissue.  When asked about breasts changes during pregnancy, mom said yes that her nipples hurt.  Infant had difficulty latching with first attempts.  laid back and football were attempted.  With more pillow support and blanket support for baby and breast, mom was able to get infant to latch and he sustained latch for 10 mintues with a few period of active sucking but followed by long pauses.  LC encouraged mom and dad to use breast compression and massage.  [facility] taught mom how to break latch if shallow and also how to hold breast tissue to help infant achieve a better latch.  LC reviewed DEBP set up, cleaning, parts, and storage.   Parents know to feed with cues and to feed at least every 3 hours.    Plan:  Hand express, breastfeed, DEBP, feed back to baby any collected EBM.  Family will call RN or [place] for assistance if needed.   Supplement guidelines reviewed with family.   Maternal Data Has patient been taught Hand Expression?: Yes(Lc assisted in expressing 2 ml) Does the patient have breastfeeding experience prior to this delivery?: No  Feeding Feeding Type: Breast Fed  LATCH Score Latch: Repeated attempts needed to sustain latch, nipple held in mouth throughout feeding, stimulation needed to elicit sucking reflex.  Audible Swallowing: None  Type of Nipple: Everted at rest and after stimulation  Comfort (Breast/Nipple): Soft / non-tender  Hold (Positioning): Assistance needed to correctly  position infant at breast and maintain latch.  LATCH Score: 6  Interventions Interventions: Breast feeding basics reviewed;Assisted with latch;Skin to skin;Breast massage;Hand express;Position options;Support pillows;Adjust position;Breast compression;DEBP  Lactation Tools Discussed/Used Pump Review: Setup, frequency, and cleaning;Milk Storage Initiated by:: Rhea Bleacher Date initiated:: 06/16/19   Consult Status Consult Status: Follow-up Date: 06/16/19 Follow-up type: In-patient    [place] 06/15/2019, 11:30 PM

## 2019-06-15 NOTE — Anesthesia Procedure Notes (Signed)
Epidural Patient location during procedure: OB Start time: 06/15/2019 4:47 PM End time: 06/15/2019 5:03 PM  Staffing Anesthesiologist: Lynda Rainwater, MD Performed: anesthesiologist   Preanesthetic Checklist Completed: patient identified, site marked, surgical consent, pre-op evaluation, timeout performed, IV checked, risks and benefits discussed and monitors and equipment checked  Epidural Patient position: sitting Prep: ChloraPrep Patient monitoring: heart rate, cardiac monitor, continuous pulse ox and blood pressure Approach: midline Location: L2-L3 Injection technique: LOR saline  Needle:  Needle type: Tuohy  Needle gauge: 17 G Needle length: 9 cm Needle insertion depth: 6 cm Catheter type: closed end flexible Catheter size: 20 Guage Catheter at skin depth: 10 cm Test dose: negative  Assessment Events: blood not aspirated, injection not painful, no injection resistance, negative IV test and no paresthesia  Additional Notes Reason for block:procedure for pain

## 2019-06-15 NOTE — Discharge Summary (Signed)
Postpartum Discharge Summary     Patient Name: Tonya Smith DOB: Aug 19, 1996 MRN: [id number]  Date of admission: 06/15/2019 Delivering Provider: Gerlene Fee   Date of discharge: 06/17/2019  Admitting diagnosis: 25 WKS, LEAKING FLUIDS, CTX Intrauterine pregnancy: [redacted]w[redacted]d     Secondary diagnosis:  Active Problems:   Preterm premature rupture of membranes   Vaginal delivery   Nicotine vapor product user  Additional problems: none     Discharge diagnosis: Preterm Pregnancy Delivered                                                                                                Post partum procedures:none  Augmentation: Pitocin, Cytotec and Foley Balloon  Complications: None  Hospital course:  Induction of Labor With Vaginal Delivery   23 y.o. yo G1P0000 at [redacted]w[redacted]d was admitted to the hospital 06/15/2019 for induction of labor.  Indication for induction: PROM at 0330 with cx ripening methods used to achieve active labor.  Patient had an uncomplicated labor course as follows: Membrane Rupture Time/Date: 3:30 AM ,06/15/2019   Intrapartum Procedures: Episiotomy: None [1]                                         Lacerations:  1st degree [2]  Patient had delivery of a Viable infant.  Information for the patient's newborn:  Brei, [place] [[id number]]  Delivery Method: Vaginal, Spontaneous(Filed from Delivery Summary)    06/15/2019  Details of delivery can be found in separate delivery note. There were tight shoulders, approx 10 seconds until delivery of body. Patient went on to have a routine postpartum course. Patient is discharged home 06/17/19.  Magnesium Sulfate recieved: No BMZ received: No  Physical exam  Vitals:   06/16/19 0946 06/16/19 1425 06/16/19 2349 06/17/19 0542  BP: 124/73 115/83 122/74 120/84  Pulse: 95 99 78 87  Resp: 16 19 18 18   Temp: 97.8 F (36.6 C) 98.5 F (36.9 C) 97.8 F (36.6 C) (!) 97.3 F (36.3 C)  TempSrc: Oral Oral Oral Oral  SpO2: 99% 99%  99% 99%  Weight:      Height:       General: alert, cooperative and no distress Lochia: appropriate Uterine Fundus: firm Incision: N/A DVT Evaluation: No evidence of DVT seen on physical exam. Labs: Lab Results  Component Value Date   WBC 20.5 (H) 06/16/2019   HGB 10.8 (L) 06/16/2019   HCT 32.7 (L) 06/16/2019   MCV 90.6 06/16/2019   PLT 185 06/16/2019   No flowsheet data found.  Discharge instruction: per After Visit Summary and "Baby and Me Booklet".  After visit meds:  Allergies as of 06/17/2019   No Known Allergies     Medication List    TAKE these medications   famotidine 20 MG tablet Commonly known as: Pepcid Take 1 tablet (20 mg total) by mouth 2 (two) times daily.   ibuprofen 600 MG tablet Commonly known as: ADVIL Take 1 tablet (600 mg total) by mouth every 6 (six)  hours.   pantoprazole 40 MG tablet Commonly known as: Protonix Take 1 tablet (40 mg total) by mouth daily.   PRENATAL VITAMIN PO Take by mouth.      Diet: routine diet  Activity: Advance as tolerated. Pelvic rest for 6 weeks.   Outpatient follow up:4 weeks Follow up Appt: Future Appointments  Date Time Provider Department Center  06/19/2019  9:55 AM Leftwich-Kirby, Wilmer FloorLisa A, CNM [facility]   Follow up Visit: Follow-up Information    [facility] [place]. Schedule an appointment as soon as possible for a visit in 4 week(s).   Specialty: Obstetrics and Gynecology Contact information: [address], [address] [place] [id number] [phone]          Please schedule this patient for Postpartum visit in: 4 weeks with the following provider: Any provider For C/S patients schedule nurse incision check in weeks 2 weeks: no Low risk pregnancy complicated by: PPROM/preterm delivery Delivery mode:  SVD Anticipated Birth Control:  IUD- placed outpatient PP Procedures needed: none  Schedule Integrated BH visit: no   Newborn  Data: Live born female  Birth Weight: 3359gm (7lb 6.5oz)  APGAR: 6, 8  Newborn Delivery   Birth date/time: 06/15/2019 18:37:00 Delivery type: Vaginal, Spontaneous      Baby Feeding: Breast and bottle Disposition:home with mother   06/17/2019 Wynelle BourgeoisMarie Lyrique Hakim, CNM

## 2019-06-16 ENCOUNTER — Encounter (HOSPITAL_COMMUNITY): Payer: Self-pay | Admitting: *Deleted

## 2019-06-16 LAB — CBC
HCT: 32.7 % — ABNORMAL LOW (ref 36.0–46.0)
Hemoglobin: 10.8 g/dL — ABNORMAL LOW (ref 12.0–15.0)
MCH: 29.9 pg (ref 26.0–34.0)
MCHC: 33 g/dL (ref 30.0–36.0)
MCV: 90.6 fL (ref 80.0–100.0)
Platelets: 185 10*3/uL (ref 150–400)
RBC: 3.61 MIL/uL — ABNORMAL LOW (ref 3.87–5.11)
RDW: 12.9 % (ref 11.5–15.5)
WBC: 20.5 10*3/uL — ABNORMAL HIGH (ref 4.0–10.5)
nRBC: 0 % (ref 0.0–0.2)

## 2019-06-16 LAB — CULTURE, BETA STREP (GROUP B ONLY)
MICRO NUMBER:: 763730
SPECIMEN QUALITY:: ADEQUATE

## 2019-06-16 LAB — ABO/RH: ABO/RH(D): A POS

## 2019-06-16 NOTE — Progress Notes (Signed)
CSW acknowledged consult and attempted to meet with MOB. However, MOB noted to be asleep in bed.  CSW will meet with MOB at a later time.  Gurman Ashland, LCSWA  [facility] [phone]     

## 2019-06-16 NOTE — Progress Notes (Signed)
Post Partum Day 1 Subjective: no complaints, up ad lib, voiding, tolerating PO and + flatus  Objective: Blood pressure 124/73, pulse 95, temperature 97.8 F (36.6 C), temperature source Oral, resp. rate 16, height 5\' 6"  (1.676 m), weight 102.5 kg, last menstrual period 09/30/2018, SpO2 99 %, unknown if currently breastfeeding.  Physical Exam:  General: alert, cooperative and no distress Lochia: appropriate Uterine Fundus: firm Incision: NA DVT Evaluation: No evidence of DVT seen on physical exam.  Recent Labs    06/15/19 0440 06/16/19 0613  HGB 12.3 10.8*  HCT 37.0 32.7*    Assessment/Plan: Plan for discharge tomorrow   LOS: 1 day   Marcille Buffy DNP, CNM  06/16/19  11:12 AM

## 2019-06-16 NOTE — Progress Notes (Signed)
CSW received consult for history of anxiety.  CSW met with MOB to offer support and complete assessment.    MOB sitting in bed holding infant with FOB present at bedside, when CSW entered the room. CSW introduced self and received verbal permission to complete assessment with FOB present. FOB did not participate during assessment and went to take a shower soon after beginning of assessment. CSW explained reason for consult to which MOB was understanding. CSW inquired about MOB's mental health history and MOB acknowledged experiencing some anxiety at least 3 years ago. MOB reported she took fluoxetine at that time but denied any symptoms or need for medications since. MOB denied any symptoms during pregnancy and did not appear to be displaying any acute mental health symptoms. CSW provided education regarding the baby blues period vs. perinatal mood disorders, discussed treatment and gave resources for mental health follow up if concerns arise.  CSW recommends self-evaluation during the postpartum time period using the New Mom Checklist from Postpartum Progress and encouraged MOB to contact a medical professional if symptoms are noted at any time. MOB denied any current SI, HI or DV and reported having good support from her mother, her father and FOB.  MOB confirmed having all essential items for infant once discharged and reported infant would be sleeping in a basinet once home. CSW provided review of Sudden Infant Death Syndrome (SIDS) precautions and safe sleeping habits.    CSW identifies no further need for intervention and no barriers to discharge at this time.  Tonya Smith, LCSWA  [facility] [phone]   

## 2019-06-16 NOTE — Progress Notes (Signed)
Met with patient at her bedside to discuss circumcision procedure. Patient expressed concern regarding newborn experiencing frequent regurgitation. Patient states that pediatrician suggested to delay circumcision. Procedure postponed to tomorrow.

## 2019-06-16 NOTE — Anesthesia Postprocedure Evaluation (Signed)
Anesthesia Post Note  Patient: Tonya Smith  Procedure(s) Performed: AN AD [place]     Patient location during evaluation: Mother Baby Anesthesia Type: Epidural Level of consciousness: awake and alert Pain management: pain level controlled Vital Signs Assessment: post-procedure vital signs reviewed and stable Respiratory status: spontaneous breathing Cardiovascular status: stable Postop Assessment: no headache, adequate PO intake, no backache, patient able to bend at knees, able to ambulate, epidural receding and no apparent nausea or vomiting Anesthetic complications: no Comments: Spoke with current Nurse    Last Vitals:  Vitals:   06/16/19 0946 06/16/19 1425  BP: 124/73 115/83  Pulse: 95 99  Resp: 16 19  Temp: 36.6 C 36.9 C  SpO2: 99% 99%    Last Pain:  Vitals:   06/16/19 1425  TempSrc: Oral  PainSc:    Pain Goal:                   Ailene Ards

## 2019-06-17 ENCOUNTER — Ambulatory Visit: Payer: Self-pay

## 2019-06-17 MED ORDER — IBUPROFEN 600 MG PO TABS: 600.0000 mg | ORAL_TABLET | Freq: Four times a day (QID) | ORAL | 0 refills | Status: DC

## 2019-06-17 NOTE — Lactation Note (Signed)
This note was copied from a baby's chart. Lactation Consultation Note  Patient Name: Tonya Smith LXBWI'[id number] Date: 06/17/2019  Gerhard Perches NP reported to this [facility] mom and baby will be staying until tomorrow and baby will need a latch assessment.   LC reported to the [facility] [place] will need a latch assessment.     Maternal Data    Feeding Feeding Type: Breast Fed  LATCH Score                   Interventions    Lactation Tools Discussed/Used     Consult Status      [place] 06/17/2019, 4:14 PM

## 2019-06-17 NOTE — Lactation Note (Signed)
This note was copied from a baby's chart. Lactation Consultation Note  Patient Name: Tonya Smith AUQJF'H Date: 06/17/2019 Reason for consult: Late-preterm 34-36.6wks;Infant weight loss;Other (Comment)(6  % weight loss)  Baby is 41 hours old Bili check at 35 hours 4.2  As LC entered the room , mom resting in bed and baby asleep in the crib.  Per mom the baby last fed at 9 am for 20 mins . Mom reports comfort with the latch and swallows.  Per mom has pumped a few times with the DEBP.  LC reviewed basics - hand expressing - and mom able to return demo with results. LC instructed mom on the use of the hand pump  And mom return demo with EBM yield with pumping. The #24 F was comfortable for mom. Also has the DEBP kit witt the #24 F and the #27 F.( for the milk comes in)  [facility] assessed breast tissue and noticed no breakdown of nipples, just pinky,  Decreased glandular tissue with wide spaced breast. Mom mentioned the areola got darker and the nipples were very sensitive, but no change in size.  LC stressed the importance of the consistent  Post pumping due to the baby being a LPT infant and her decreased breast changes.  Mom denies soreness , sore nipple and engorgement prevention and tx reviewed.  Per mom doesn't have a DEBP or any pump at home. Active with [facility]. LC recommended if the baby is D/C today prior to latching every feeding breast massage, hand express, pre-pump with the hand pump to enhance let down and feed the baby. Offer 2nd breast.  If at the 1st weight check is not stable or decreased to switch and feed 15 -20 mins - 30 mins at the breast and supplement 30 ml of EBM or formula and post pump.  Mom plans to call [facility] and leave a message for a DEBP and LC is going to fax a DEBP referral request.      Maternal Data Has patient been taught Hand Expression?: Yes Does the patient have breastfeeding experience prior to this delivery?: No  Feeding Feeding  Type: (baby last fed at 0900)  LATCH Score                   Interventions Interventions: Breast feeding basics reviewed;DEBP;Hand pump  Lactation Tools Discussed/Used Tools: Pump Breast pump type: Manual;Double-Electric Breast Pump WIC Program: Yes Pump Review: Milk Storage   Consult Status Consult Status: Complete Date: 06/17/19    Myer Haff 06/17/2019, 11:55 AM

## 2019-06-17 NOTE — Lactation Note (Signed)
This note was copied from a baby's chart. Lactation Consultation Note  Patient Name: Tonya Smith GNFAO'ZToday's Date: 06/17/2019 Reason for consult: Follow-up assessment;Primapara;1st time breastfeeding;Late-preterm 34-36.6wks  LC in to visit with P1 Mom of LPTI.  Baby 48 hrs old at 6.8% weight loss at 8535 hrs old. Output adequate. Baby a patient due to LPTI and to work on feedings.  Mom has been exclusively breastfeeding baby, with last supplement being early this am and "very little".    Physical exam of breasts very suspicious of IGT (Insuffient glandular tissue) due to small, pendulous breasts that are widely spaced, nipples pointing down.  Mom denies any breast enlargement in early pregnancy.  When palpated, unable to identify milk ducts.  No veining noted of breasts.   Mom has only pumped once.  She did use the hand pump a little.  Hand expression reveals nice flow of colostrum.  Asked Mom if she would position and latch baby, so LC could assess.  Mom laid back and placed baby over her breast.  Baby latched onto nipple only.  Offered to assist with a deeper latch.  Sat Mom upright, with pillow support under baby.  Assisted Mom to use a U hold at base of her breast (not much breast tissue, so risk of breast support being too close to baby's latch).  Assisted Mom to hold securely her baby's head.  Baby opens widely and assisted Mom to bring baby quickly onto breast.  Baby taken off breast and nipple rounded, not pinched.  Mom latched baby with a little help again.  Taught Mom to use alternate breast compression.  Unable to identify regular swallows, most non-nutritive sucking noted.  One swallow noted during 10 min feeding.  Mom stated she was concerned for not hearing any swallows at each feeding.   Discussed with Mom about concerns about her breasts and baby's being non-nutritive at the breast.  It was recommended to Mom that baby should routinely supplement at every feeding, until we can be  sure her breasts fill with milk (Lactogenesis II) Explained that this can take 2-5 days.    Left room to get formula for supplementation, Mom had put baby on the breast, using good positioning.  Baby falling asleep at the breast, but remained latched.    Initiated a 245fr feeding tube while baby on the breast.  Baby able to take 22 ml formula at the breast.  Baby noticeably sucking with deeper jaw extensions, pausing and swallowing heard. Mom knows she will need help doing this, and currently is by herself.  Suggested she ask her nurse.    Another important step stressed is pumping.  Mom assisted to double pump on initiation setting.  Left breast expressing small amount of colostrum.  Mom knows she needs to save this for next feeding supplement.  Encouragement given to Mom.  Reviewed importance of disassembling pump parts, washing, rinsing, and air drying in separate bin provided.  Plan- 1- Keep baby STS as much as possible, watching for cues. 2- Breastfeed baby with any cue, or at least awaken for a feeding at 3 hrs. 3-supplement baby with 20-30 ml EBM+/formula by paced bottle (taught this to Mom) or feeding tube at breast. 4- Pump both breasts 15 mins on initiation setting. 5- ask for help prn.  [place]  Plans to obtain a DEBP from them on discharge [facility] Talked about importance of benefit of coming back for an OP lactation appointment  once milk comes to volume, or later this coming week.  Mom is interested.    LATCH Score Latch: Grasps breast easily, tongue down, lips flanged, rhythmical sucking.  Audible Swallowing: Spontaneous and intermittent(with supplement at breast)  Type of Nipple: Everted at rest and after stimulation  Comfort (Breast/Nipple): Soft / non-tender  Hold (Positioning): Assistance needed to correctly position infant at breast and maintain latch.  LATCH Score: 9  Interventions Interventions: Breast feeding basics  reviewed;Assisted with latch;Skin to skin;Breast massage;Hand express;Breast compression;Adjust position;Support pillows;Position options;Expressed milk;DEBP;Hand pump  Lactation Tools Discussed/Used Tools: Pump;Bottle;46F feeding tube / Syringe Breast pump type: Double-Electric Breast Pump   Consult Status Consult Status: Follow-up Date: 06/18/19 Follow-up type: In-patient    [place], [place] 06/17/2019, 6:41 PM

## 2019-06-17 NOTE — Discharge Instructions (Signed)

## 2019-06-18 ENCOUNTER — Ambulatory Visit: Payer: Self-pay

## 2019-06-18 NOTE — Lactation Note (Signed)
This note was copied from a baby's chart. Lactation Consultation Note  Patient Name: Tonya Smith IRCVE'L Date: 06/18/2019    Mom said she understands her concerns about her breasts & that she knows she has to "work harder." Mom has already procured her pump through [employer] office. Mom understands to pump often (including when the breast is not offered and infant receives just a bottle of formula).   Mom says she hears swallows when the infant is at the breast. As infant is an LPI & there are concerns about how much milk Mom will make, I suggested that once she no longer hears swallows at the breast, to take infant off breast and finish supplementing with a bottle. I also suggested that since some LPIs are too tired to go to the breast every feeding, she can skip feeding at the breast at times & just give a bottle for that feeding. I emphasized feeding infant until satiated. Mom is aware that infant will do better as he gets closer to his due date.   I demonstrated how to do paced bottle-feeding. Infant did well with the Dr. Saul Fordyce bottle that parents had brought from home.  Parents were shown how to assemble & use hand pump (single- & double-mode) that was included in pump kit. Washing & sanitizing of pump parts also discussed. Mom says she has sanitation bags that she can use. When I was showing Mom how to use the piston hand pump, I noticed sprays of milk. Size 24 flanges are appropriate for her at this time.    Tonya Smith [facility] 06/18/2019, 1:06 PM

## 2019-06-19 ENCOUNTER — Encounter: Payer: BC Managed Care – PPO | Admitting: Advanced Practice Midwife

## 2019-07-16 ENCOUNTER — Encounter: Payer: Self-pay | Admitting: Obstetrics & Gynecology

## 2019-07-16 ENCOUNTER — Other Ambulatory Visit: Payer: Self-pay

## 2019-07-16 ENCOUNTER — Ambulatory Visit (INDEPENDENT_AMBULATORY_CARE_PROVIDER_SITE_OTHER): Payer: BC Managed Care – PPO | Admitting: Obstetrics & Gynecology

## 2019-07-16 ENCOUNTER — Encounter: Payer: Self-pay | Admitting: *Deleted

## 2019-07-16 DIAGNOSIS — Z1389 Encounter for screening for other disorder: Secondary | ICD-10-CM

## 2019-07-16 MED ORDER — NORETHIN-ETH ESTRADIOL-FE 0.8-25 MG-MCG PO CHEW: 1.0000 | CHEWABLE_TABLET | Freq: Every day | ORAL | 11 refills | Status: DC

## 2019-07-16 NOTE — Progress Notes (Signed)
Subjective:     Tonya Smith is a 23 y.o. female who presents for a postpartum visit. She is 4 weeks postpartum following a spontaneous vaginal delivery. I have fully reviewed the prenatal and intrapartum course. The delivery was at 36.6 gestational weeks. Outcome: spontaneous vaginal delivery. Anesthesia: epidural. Postpartum course has been normal. Baby's course has been normal  Baby is feeding by breast and bottle. She plans to return to work next week and stop breastfeeding then.  Bleeding no bleeding. Bowel function is normal. Bladder function is normal. Patient is not sexually active. Contraception method is she wants to start OCPs Postpartum depression screening: neg  The following portions of the patient's history were reviewed and updated as appropriate: allergies, current medications, past family history, past medical history, past social history, past surgical history and problem list.  Review of Systems Pertinent items are noted in HPI.   Pap smear normal 6/19. She says that she had the Gardasil series as a teenager. She works at [employer].  Objective:    LMP 09/30/2018   General:  alert   Breasts:  inspection negative, no nipple discharge or bleeding, no masses or nodularity palpable  Lungs: clear to auscultation bilaterally  Heart:  regular rate and rhythm, S1, S2 normal, no murmur, click, rub or gallop  Abdomen: soft, non-tender; bowel sounds normal; no masses,  no organomegaly   Vulva:  not evaluated  Vagina: not evaluated  Cervix:  not evaluated  Corpus: not examined  Adnexa:  not evaluated  Rectal Exam: Not performed.        Assessment:      Normal postpartum exam. Pap smear not done at today's visit.   Plan:    1. Contraception: OCP (estrogen/progesterone). She wants generess, which she used in the past. 2. BP check in 4 weeks, she will do this at home and call if it is greater than 140/90 Back up method for 4 weeks

## 2019-07-17 ENCOUNTER — Ambulatory Visit: Payer: BC Managed Care – PPO | Admitting: Certified Nurse Midwife

## 2020-01-04 ENCOUNTER — Ambulatory Visit: Payer: Medicaid Other | Admitting: Women's Health

## 2020-10-11 IMAGING — US US MFM OB DETAIL +14 WK
1 series · 14 of 28 positions shown · non-contrast
Comparison: none

[Series 1: us mfm ob detail +14 wk · 112 acquisitions, 14 frames shown]
[im 5/112]
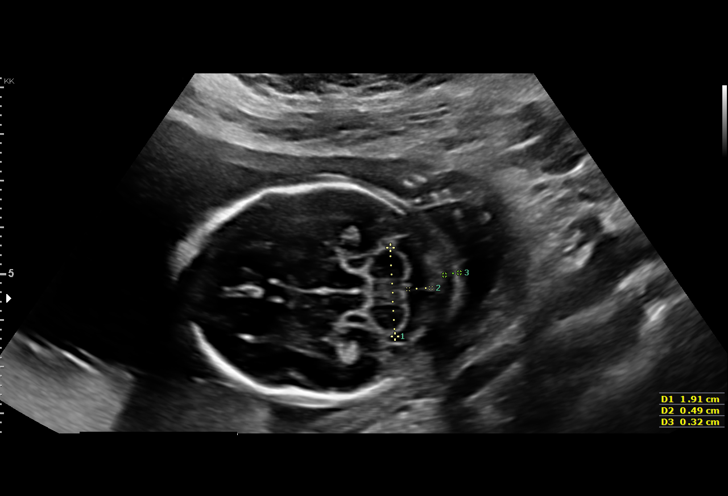
[im 13/112]
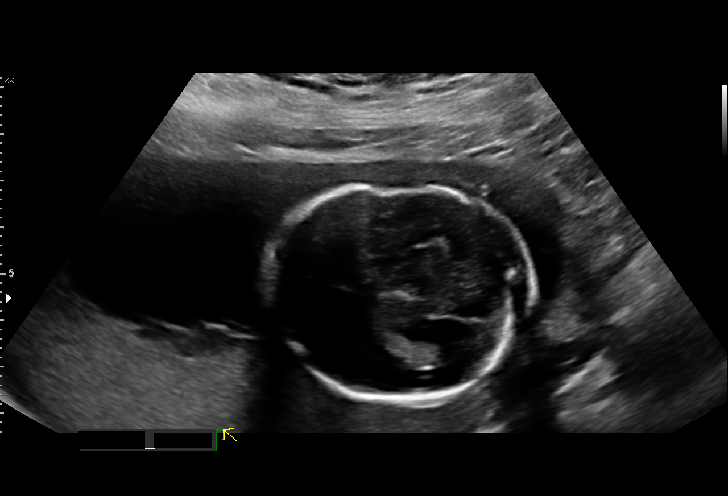
[im 21/112]
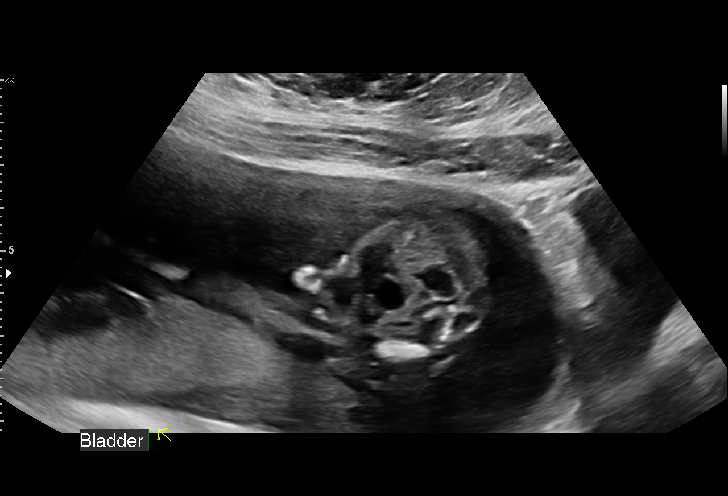
[im 29/112]
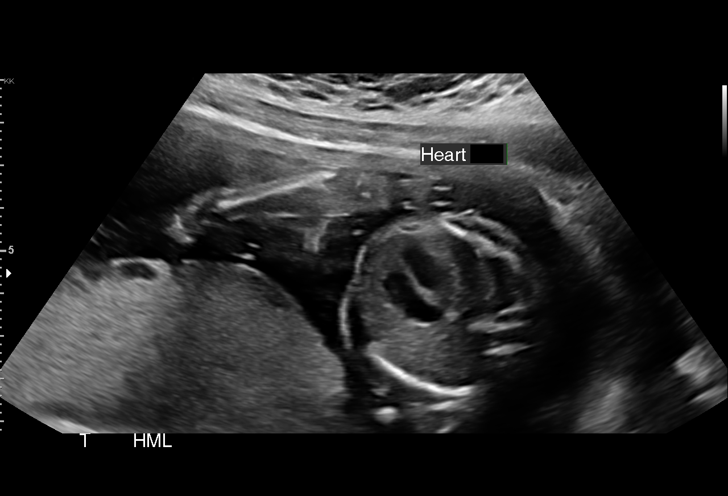
[im 38/112]
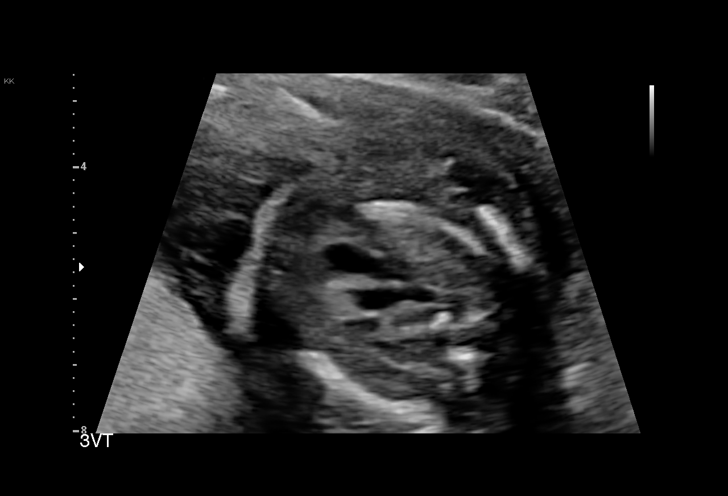
[im 46/112]
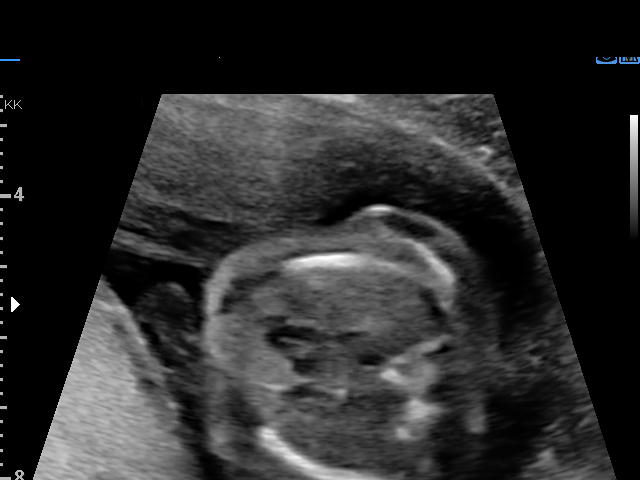
[im 54/112]
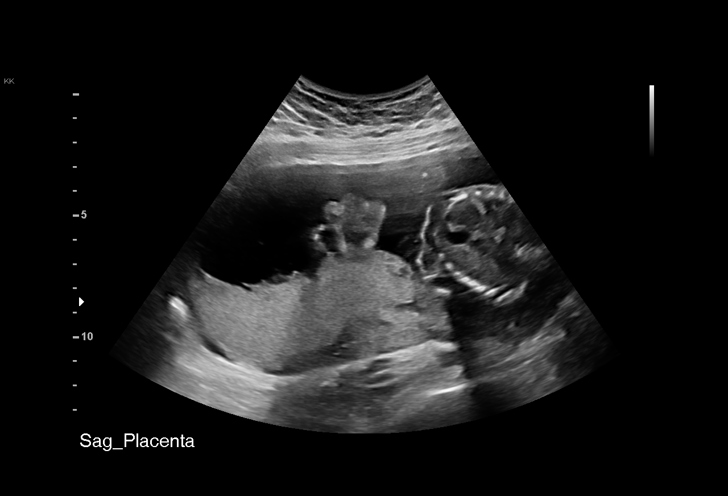
[im 62/112]
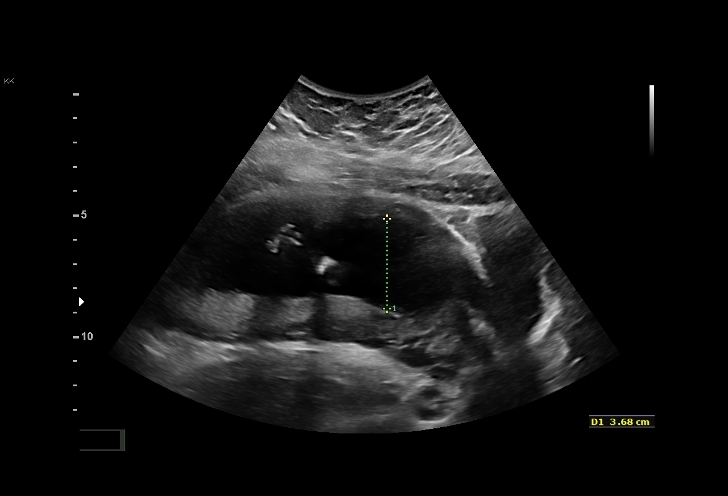
[im 70/112]
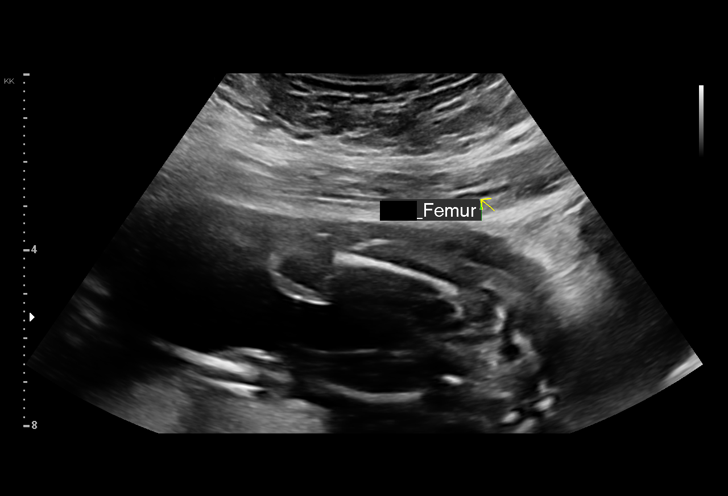
[im 79/112]
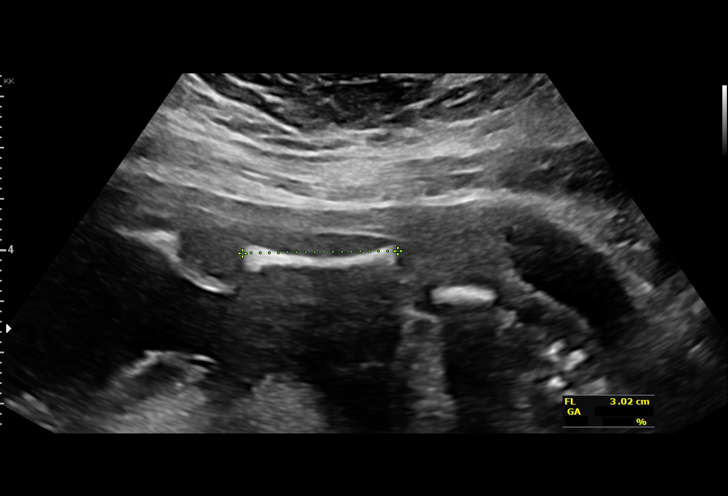
[im 87/112]
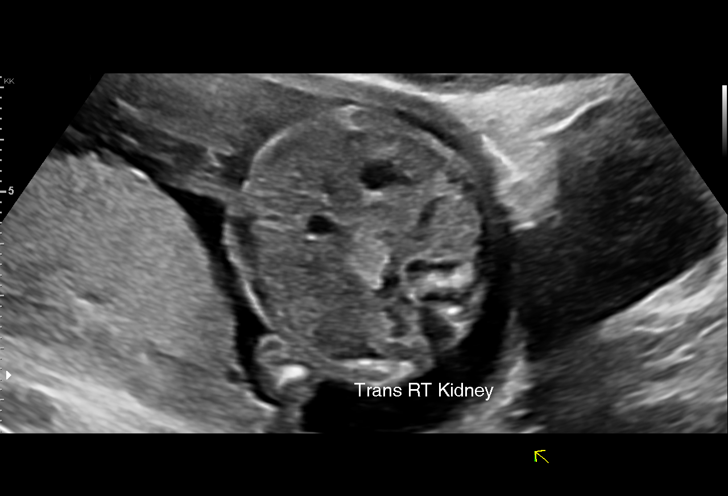
[im 95/112]
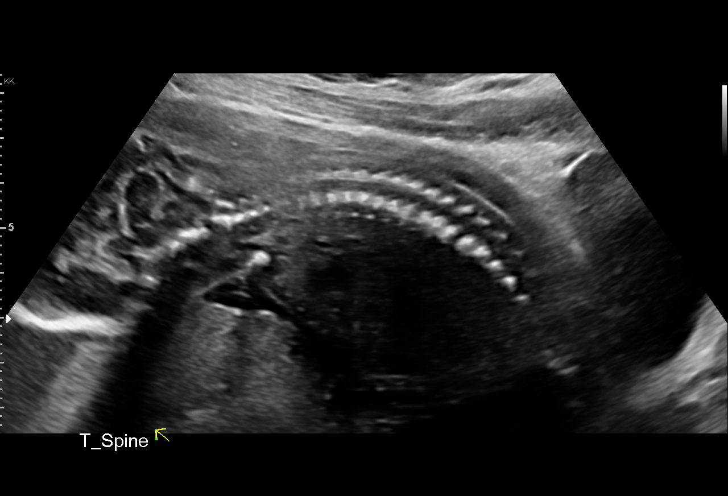
[im 103/112]
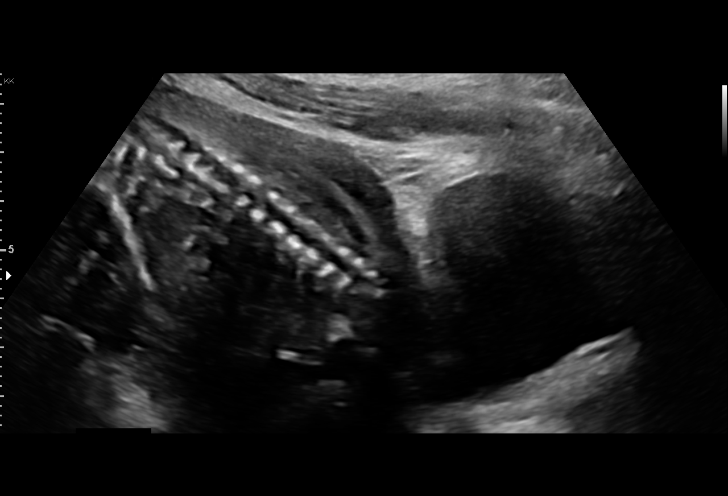
[im 112/112]
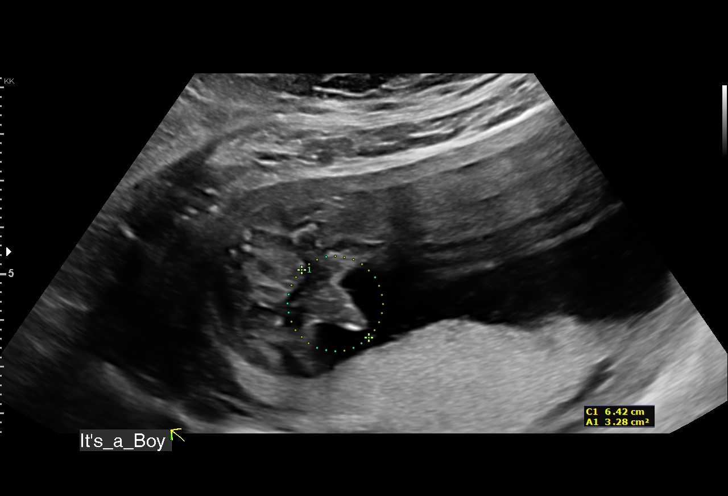

[14 of 28 positions shown; findings below may reference images not displayed]

----------------------------------------------------------------------

 ----------------------------------------------------------------------
Indications

  Encounter for antenatal screening for
  malformations
  18 weeks gestation of pregnancy
  Tobacco use complicating pregnancy,
  second trimester( 1 ppd)
  Obesity complicating pregnancy, second
  trimester (Negative AFP)
 ----------------------------------------------------------------------
Vital Signs

 BMI:
Fetal Evaluation

 Num Of Fetuses:          1
 Fetal Heart Rate(bpm):   167
 Cardiac Activity:        Observed
 Presentation:            Transverse, head to maternal left
 Placenta:                Posterior
 P. Cord Insertion:       Visualized

 Amniotic Fluid
 AFI FV:      Within normal limits

                             Largest Pocket(cm)

Biometry

 BPD:      43.8  mm     G. Age:  19w 2d         87  %    CI:        70.22   %    70 - 86
                                                         FL/HC:       18.0  %    15.8 - 18
 HC:      166.7  mm     G. Age:  19w 3d         88  %    HC/AC:       1.03       1.07 -
 AC:      161.9  mm     G. Age:  21w 2d       > 97  %    FL/BPD:      68.5  %
 FL:         30  mm     G. Age:  19w 2d         78  %    FL/AC:       18.5  %    20 - 24
 HUM:      30.5  mm     G. Age:  20w 1d       > 95  %
 NFT:       3.2  mm

 Est. FW:     339   gm   0 lb 12 oz    > 75  %
OB History

 Gravidity:    1
Gestational Age

 LMP:           18w 2d        Date:  09/30/18                 EDD:   07/07/19
 U/S Today:     19w 6d                                        EDD:   06/26/19
 Best:          18w 2d     Det. By:  LMP  (09/30/18)          EDD:   07/07/19
Anatomy

 Cranium:               Appears normal         Aortic Arch:            Appears normal
 Cavum:                 Appears normal         Ductal Arch:            Not well visualized
 Ventricles:            Appears normal         Diaphragm:              Appears normal
 Choroid Plexus:        Appears normal         Stomach:                Appears normal, left
                                                                       sided
 Cerebellum:            Appears normal         Abdomen:                Appears normal
 Posterior Fossa:       Appears normal         Abdominal Wall:         Appears nml (cord
                                                                       insert, abd wall)
 Nuchal Fold:           Appears normal         Cord Vessels:           Appears normal (3
                                                                       vessel cord)
 Face:                  Not well visualized    Kidneys:                Appear normal
 Lips:                  Appears normal         Bladder:                Appears normal
 Thoracic:              Appears normal         Spine:                  Appears normal
 Heart:                 Not well visualized    Upper Extremities:      Appears normal
 RVOT:                  Appears normal         Lower Extremities:      Appears normal
 LVOT:                  Appears normal

 Other:  Male gender. Technically difficult due to maternal habitus and fetal
         position.
Cervix Uterus Adnexa

 Cervix
 Length:            3.2  cm.
 Normal appearance by transabdominal scan.
Impression

 Normal interval growth.  No ultrasonic evidence of structural
 fetal anomalies.
 Suboptimal views of the fetal anatomy was obtained
 secondary to fetal position.
Recommendations

 Follow up anatomy in 6 weeks.

## 2020-10-13 ENCOUNTER — Emergency Department (INDEPENDENT_AMBULATORY_CARE_PROVIDER_SITE_OTHER)
Admission: EM | Admit: 2020-10-13 | Discharge: 2020-10-13 | Disposition: A | Discharge status: Home / Self Care | Payer: Medicaid Other | Source: Home / Self Care

## 2020-10-13 ENCOUNTER — Other Ambulatory Visit: Payer: Self-pay

## 2020-10-13 ENCOUNTER — Emergency Department (INDEPENDENT_AMBULATORY_CARE_PROVIDER_SITE_OTHER): Payer: Medicaid Other

## 2020-10-13 ENCOUNTER — Emergency Department: Admit: 2020-10-13 | Discharge status: Absent without leave | Payer: Self-pay

## 2020-10-13 DIAGNOSIS — R1010 Upper abdominal pain, unspecified: Secondary | ICD-10-CM | POA: Diagnosis not present

## 2020-10-13 DIAGNOSIS — R110 Nausea: Secondary | ICD-10-CM | POA: Diagnosis not present

## 2020-10-13 DIAGNOSIS — R1110 Vomiting, unspecified: Secondary | ICD-10-CM

## 2020-10-13 LAB — POCT URINALYSIS DIP (MANUAL ENTRY)
Bilirubin, UA: NEGATIVE
Blood, UA: NEGATIVE
Glucose, UA: NEGATIVE mg/dL
Ketones, POC UA: NEGATIVE mg/dL
Leukocytes, UA: NEGATIVE
Nitrite, UA: NEGATIVE
Protein Ur, POC: NEGATIVE mg/dL
Spec Grav, UA: 1.02
Urobilinogen, UA: 0.2 U/dL
pH, UA: 5

## 2020-10-13 MED ORDER — POLYETHYLENE GLYCOL 3350 17 GM/SCOOP PO POWD: 17.0000 g | Freq: Every day | ORAL | 0 refills | Status: DC

## 2020-10-13 NOTE — ED Provider Notes (Signed)
[facility]    CSN: [id number] Arrival date & time: 10/13/20  1011      History   Chief Complaint Chief Complaint  Patient presents with  . Abdominal Pain    HPI Tonya Smith is a 24 y.o. female.   Initial [facility] visit  Pt c/o abd pain since yesterday, started out in center and radiated LT towards the back. Denies any urinary symptoms. Pain mostly in back now. Heating bad and icing prn. Pain 2/10  Pain had a gradual onset after mild breakfast and was fluctuating and crampy all day, relieved for 2 hours by ibuprofen, but then patient had pain much of the night.  Using heating pads to relieve pain.  Not sexually active.  Associated with nausea but no diarrhea or significant vomiting.  No fever.  No past episodes  Works as [employer] forces operational officer.     Past Medical History:  Diagnosis Date  . Anxiety   . Vaginal Pap smear, abnormal     Patient Active Problem List   Diagnosis Date Noted  . Preterm premature rupture of membranes 06/15/2019  . Vaginal delivery 06/15/2019  . Supervision of low-risk first pregnancy 12/06/2018  . 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 Generalized anxiety disorder 07/20/2017  . Overweight (BMI 25.0-29.9) 05/21/2008    Past Surgical History:  Procedure Laterality Date  . broken leg     screws and rods in L leg  . TONSILLECTOMY    . wisdom teeth removal      OB History    Gravida  1   Para  1   Term  0   Preterm  1   AB  0   Living  1     SAB  0   IAB  0   Ectopic  0   Multiple  0   Live Births  1            Home Medications    Prior to Admission medications   Medication Sig Start Date End Date Taking? Authorizing Provider  escitalopram (LEXAPRO) 10 MG tablet Take by mouth. 09/19/20  Yes [provider]  polyethylene glycol powder (MIRALAX) 17 GM/SCOOP powder Take 17 g by mouth daily. 10/13/20   Elvina Sidle, MD  famotidine (PEPCID) 20 MG tablet Take 1  tablet (20 mg total) by mouth 2 (two) times daily. Patient not taking: Reported on 04/17/2019 02/28/19 10/13/20  Allie Bossier, MD  Norethindrone-Ethinyl Estradiol-Fe (GENERESSE) 0.8-25 MG-MCG tablet Chew 1 tablet by mouth daily. 07/16/19 10/13/20  Allie Bossier, MD  pantoprazole (PROTONIX) 40 MG tablet Take 1 tablet (40 mg total) by mouth daily. Patient not taking: No sig reported 03/06/19 10/13/20  Leftwich-Kirby, Wilmer Floor, CNM    Family History Family History  Problem Relation Age of Onset  . Diabetes Father   . Hypertension Father     Social History Social History   Tobacco Use  . Smoking status: Former Smoker    Packs/day: 1.00  . Smokeless tobacco: Never Used  Vaping Use  . Vaping Use: Never used  Substance Use Topics  . Alcohol use: Yes    Comment: Rarely  . Drug use: No     Allergies   Patient has no known allergies.   Review of Systems Review of Systems  Gastrointestinal: Positive for abdominal pain.  All other systems reviewed and are negative.    Physical Exam Triage Vital Signs ED Triage Vitals  Enc Vitals Group  BP 10/13/20 1024 (!) 143/79     Pulse Rate 10/13/20 1024 96     Resp 10/13/20 1024 17     Temp 10/13/20 1024 98.2 F (36.8 C)     Temp Source 10/13/20 1024 Oral     SpO2 10/13/20 1024 100 %     Weight --      Height --      Head Circumference --      Peak Flow --      Pain Score 10/13/20 1025 2     Pain Loc --      Pain Edu? --      Excl. in GC? --    No data found.  Updated Vital Signs BP (!) 143/79 (BP Location: Right Arm)   Pulse 96   Temp 98.2 F (36.8 C) (Oral)   Resp 17   LMP 09/16/2020 (Approximate)   SpO2 100%    Physical Exam Vitals and nursing note reviewed.  Constitutional:      Appearance: She is well-developed. She is obese.  HENT:     Mouth/Throat:     Mouth: Mucous membranes are moist.  Cardiovascular:     Rate and Rhythm: Normal rate.  Pulmonary:     Effort: Pulmonary effort is normal.  Abdominal:      General: Abdomen is protuberant. Bowel sounds are normal.     Palpations: Abdomen is soft.     Tenderness: There is abdominal tenderness in the left upper quadrant.  Skin:    General: Skin is warm and dry.  Neurological:     General: No focal deficit present.     Mental Status: She is alert.  Psychiatric:        Mood and Affect: Mood normal.        Behavior: Behavior normal.      UC Treatments / Results  Labs (all labs ordered are listed, but only abnormal results are displayed) Labs Reviewed  POCT URINALYSIS DIP (MANUAL ENTRY)    EKG   Radiology Large stool burden on KUB  Procedures Procedures (including critical care time)  Medications Ordered in UC Medications - No data to display  Initial Impression / Assessment and Plan / UC Course  I have reviewed the triage vital signs and the nursing notes.  Pertinent labs & imaging results that were available during my care of the patient were reviewed by me and considered in my medical decision making (see chart for details).    Final Clinical Impressions(s) / UC Diagnoses   Final diagnoses:  Pain of upper abdomen   Discharge Instructions   None    ED Prescriptions    Medication Sig Dispense Auth. Provider   polyethylene glycol powder (MIRALAX) 17 GM/SCOOP powder Take 17 g by mouth daily. 255 g Elvina Sidle, MD     PDMP not reviewed this encounter.   Elvina Sidle, MD 10/13/20 1126

## 2020-10-13 NOTE — ED Triage Notes (Signed)
Pt c/o abd pain since yesterday, started out in center and radiated LT towards the back. Denies any urinary symptoms. Pain mostly in back now. Heating bad and icing prn. Pain 2/10

## 2020-10-28 ENCOUNTER — Telehealth: Payer: Self-pay

## 2020-10-28 DIAGNOSIS — O219 Vomiting of pregnancy, unspecified: Secondary | ICD-10-CM

## 2020-10-28 MED ORDER — PROMETHAZINE HCL 25 MG PO TABS: 25.0000 mg | ORAL_TABLET | Freq: Four times a day (QID) | ORAL | 0 refills | Status: DC | PRN

## 2020-10-28 NOTE — Telephone Encounter (Signed)
Pt states LMP was 09/01/20 and she is experiencing nausea and vomiting in pregnancy. Pt states she has been nauseous for two weeks now and started vomiting today after eating [employer]. Pt is aware that she could have possibly gotten food poisining and if it doesn't get better and she can't keep fluids down then she will need to go to [facility] to be evaluated. Phenergan sent in per Donette Larry, CNM.

## 2020-11-08 ENCOUNTER — Other Ambulatory Visit: Payer: Medicaid Other

## 2020-11-08 DIAGNOSIS — Z20822 Contact with and (suspected) exposure to covid-19: Secondary | ICD-10-CM

## 2020-11-11 LAB — NOVEL CORONAVIRUS, NAA: SARS-CoV-2, NAA: NOT DETECTED

## 2020-11-22 IMAGING — US US MFM OB FOLLOW UP
1 series · 14 of 28 positions shown · non-contrast
Comparison: none

[Series 1: us mfm ob follow up · 65 acquisitions, 14 frames shown]
[im 3/65]
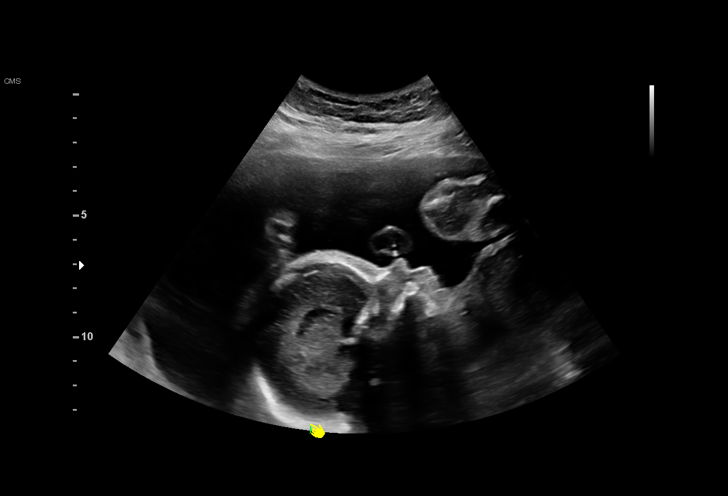
[im 8/65]
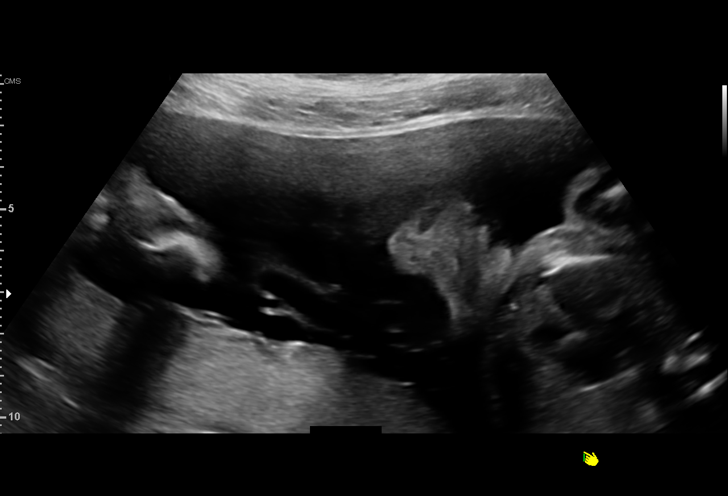
[im 12/65]
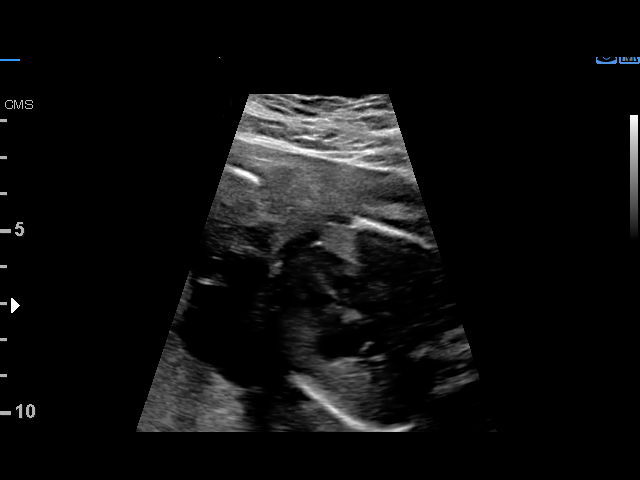
[im 17/65]
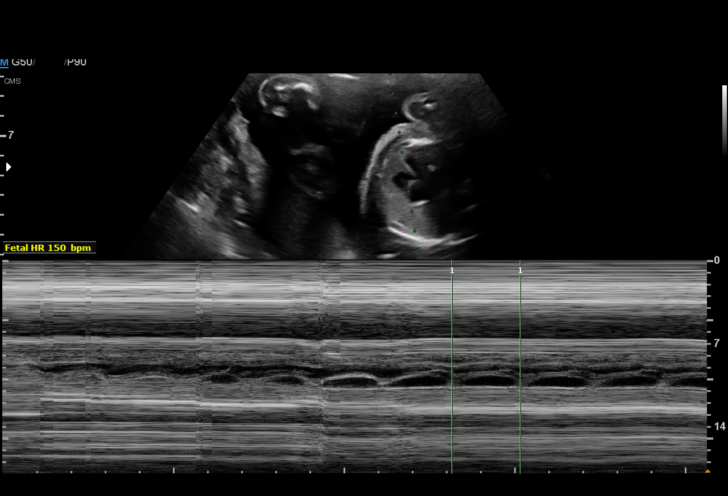
[im 22/65]
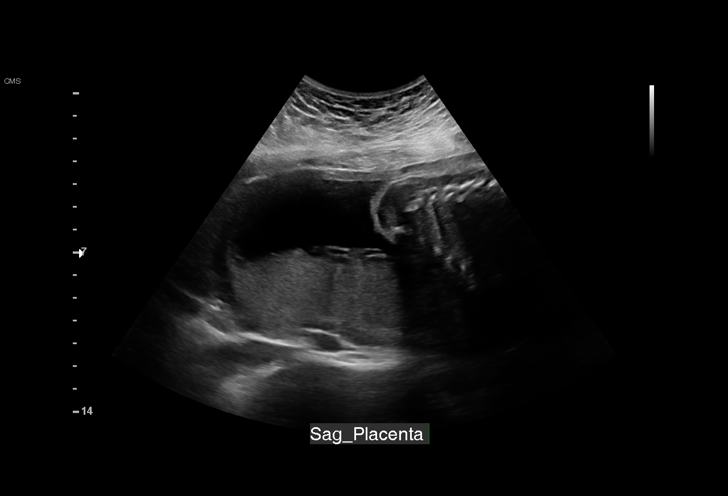
[im 27/65]
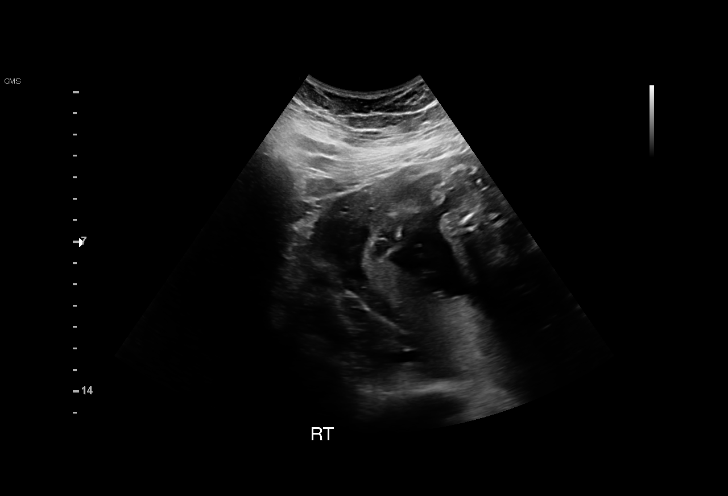
[im 31/65]
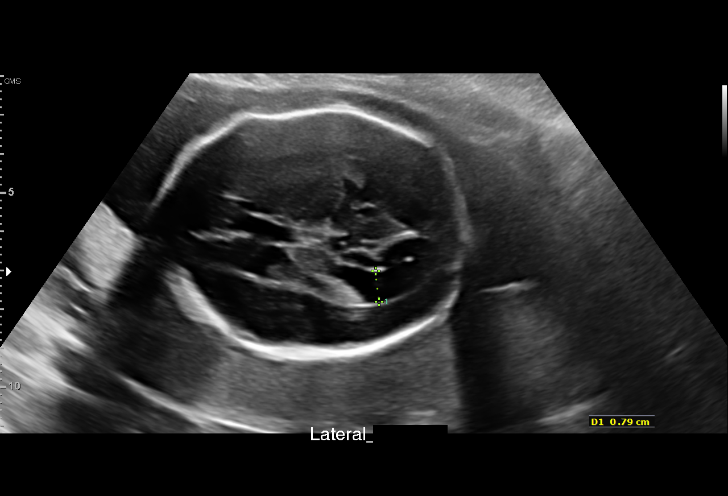
[im 36/65]
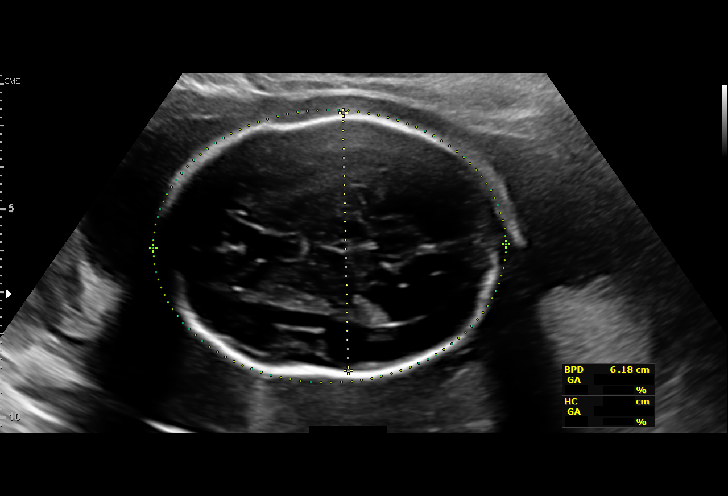
[im 41/65]
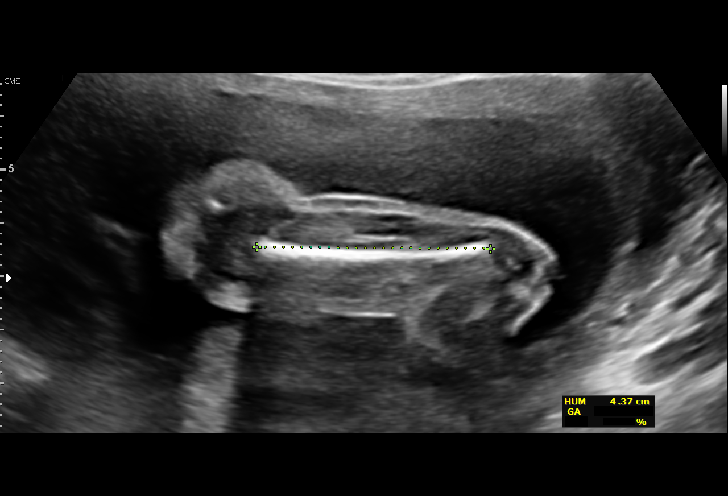
[im 46/65]
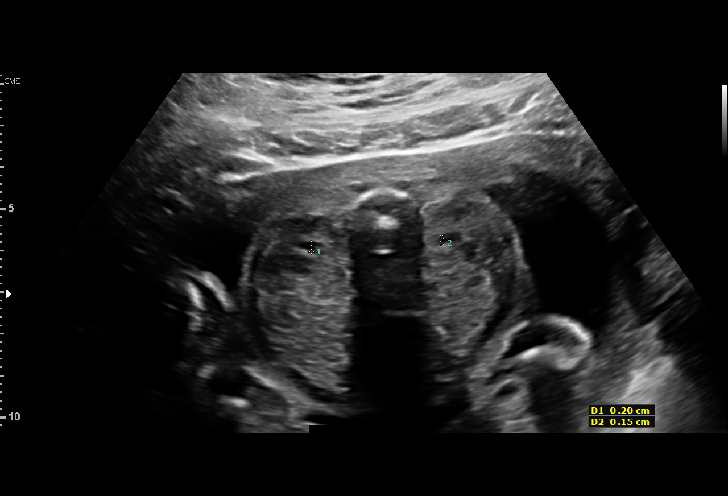
[im 50/65]
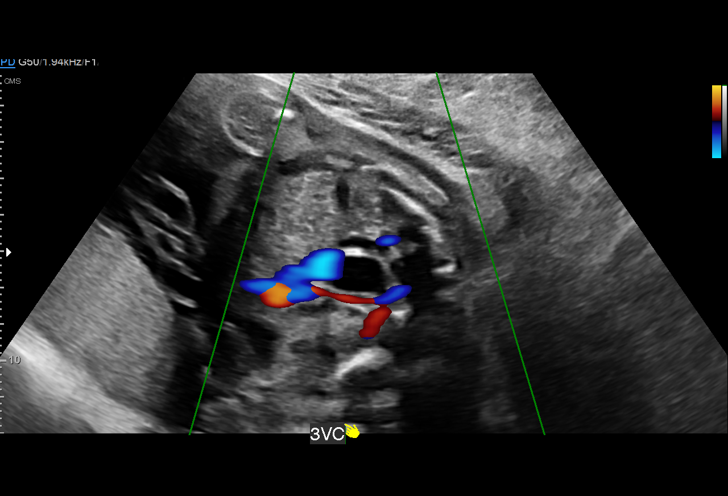
[im 55/65]
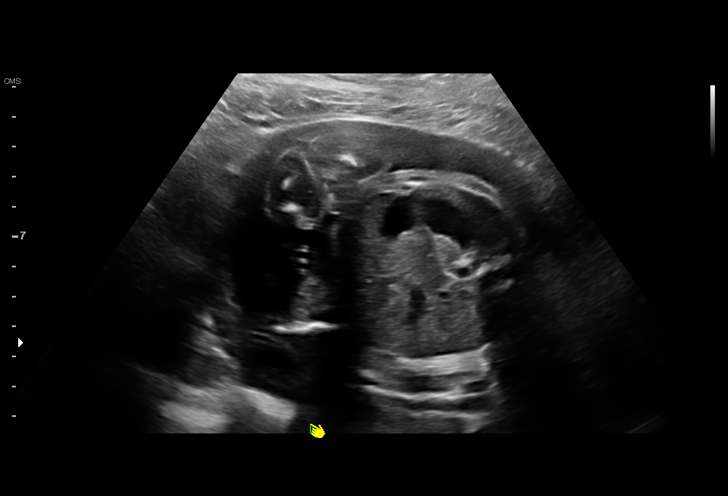
[im 60/65]
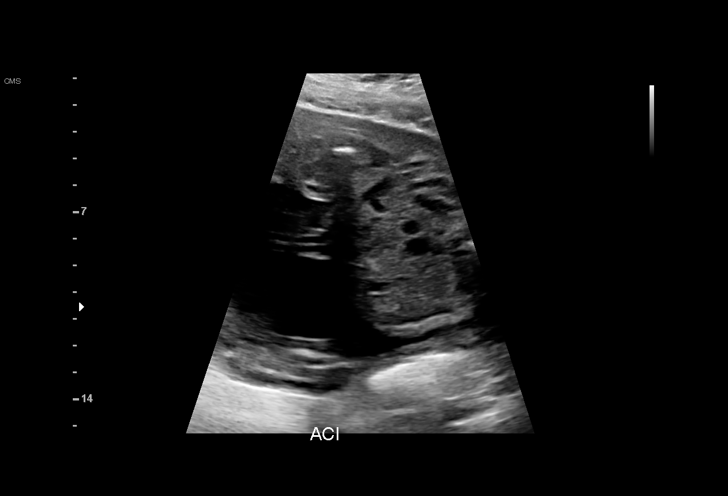
[im 65/65]
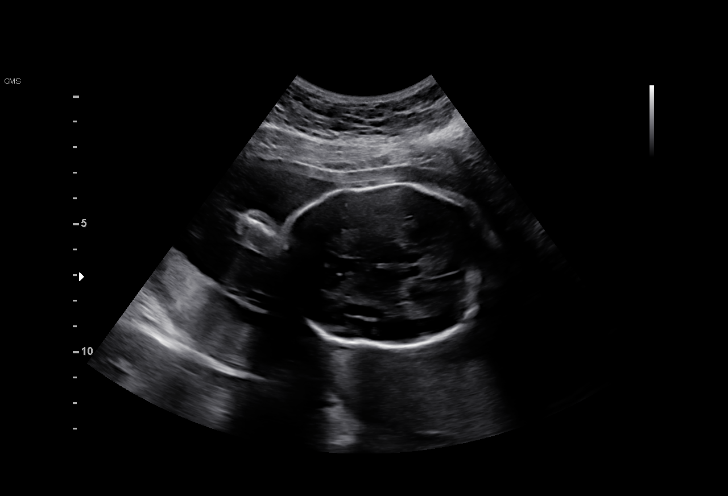

[14 of 28 positions shown; findings below may reference images not displayed]

VAMO
 ----------------------------------------------------------------------

 ----------------------------------------------------------------------
Indications

  Antenatal follow-up for nonvisualized fetal
  anatomy
  Tobacco use complicating pregnancy,
  second trimester( 1 ppd)
  24 weeks gestation of pregnancy
 ----------------------------------------------------------------------
Vital Signs

                                                Height:        5'6"
Fetal Evaluation

 Num Of Fetuses:         1
 Fetal Heart Rate(bpm):  150
 Cardiac Activity:       Observed
 Presentation:           Breech
 Placenta:               Posterior
 P. Cord Insertion:      Previously Visualized

 Amniotic Fluid
 AFI FV:      Within normal limits

                             Largest Pocket(cm)

Biometry

 BPD:      61.8  mm     G. Age:  25w 1d         72  %    CI:        69.22   %    70 - 86
                                                         FL/HC:      19.4   %    18.7 -
 HC:      237.2  mm     G. Age:  25w 6d         82  %    HC/AC:      1.08        1.05 -
 AC:      219.6  mm     G. Age:  26w 3d         93  %    FL/BPD:     74.4   %    71 - 87
 FL:         46  mm     G. Age:  25w 2d         68  %    FL/AC:      20.9   %    20 - 24
 HUM:      43.9  mm     G. Age:  26w 0d         87  %
 Est. FW:     863  gm    1 lb 14 oz      78  %
OB History

 Gravidity:    1
Gestational Age

 LMP:           24w 2d        Date:  09/30/18                 EDD:   07/07/19
 U/S Today:     25w 5d                                        EDD:   06/27/19
 Best:          24w 2d     Det. By:  LMP  (09/30/18)          EDD:   07/07/19
Anatomy

 Cavum:                 Appears normal         Aortic Arch:            Appears normal
 Ventricles:            Appears normal         Ductal Arch:            Not well visualized
 Choroid Plexus:        Appears normal         Diaphragm:              Previously seen
 Cerebellum:            Appears normal         Stomach:                Appears normal, left
                                                                       sided
 Posterior Fossa:       Previously seen        Abdomen:                Appears normal
 Nuchal Fold:           Previously seen        Abdominal Wall:         Previously seen
 Face:                  Appears normal         Cord Vessels:           Appears normal (3
                        (orbits and profile)                           vessel cord)
 Lips:                  Appears normal         Kidneys:                Appear normal
 Thoracic:              Appears normal         Bladder:                Appears normal
 Heart:                 Not well visualized    Spine:                  Previously seen
 RVOT:                  Previously seen        Upper Extremities:      Previously seen
 LVOT:                  Appears normal         Lower Extremities:      Previously seen

 Other:  Heels and 5th digits/open hands visualized. Technically difficult due
         to maternal habitus and fetal position.
Cervix Uterus Adnexa

 Cervix
 Not visualized (advanced GA >07wks)

 Uterus
 No abnormality visualized.

 Left Ovary
 Not visualized.

 Right Ovary
 Not visualized.

 Cul De Sac
 No free fluid seen.

 Adnexa
 No abnormality visualized.
Impression

 Patient returned for completion of fetal anatomy.
 Amniotic fluid is normal and good fetal activity is seen. Fetal
 growth is appropriate for gestational age. Cardiac anatomy
 appears normal.
Recommendations

 An appointment was made for her to return in 8 weeks for
 fetal growth assessment (and to evaluate 4-chamber view).
                 Tiger, [place]

## 2020-11-24 ENCOUNTER — Other Ambulatory Visit: Payer: Medicaid Other

## 2020-11-27 ENCOUNTER — Other Ambulatory Visit: Payer: Medicaid Other

## 2020-11-27 DIAGNOSIS — Z20822 Contact with and (suspected) exposure to covid-19: Secondary | ICD-10-CM

## 2020-11-28 LAB — NOVEL CORONAVIRUS, NAA: SARS-CoV-2, NAA: NOT DETECTED

## 2020-11-28 LAB — SARS-COV-2, NAA 2 DAY TAT

## 2021-06-12 LAB — HM PAP SMEAR: HM Pap smear: NORMAL

## 2021-11-20 ENCOUNTER — Ambulatory Visit: Payer: Self-pay

## 2021-12-07 ENCOUNTER — Other Ambulatory Visit (HOSPITAL_COMMUNITY)
Admission: RE | Admit: 2021-12-07 | Discharge: 2021-12-07 | Disposition: A | Discharge status: Home / Self Care | Payer: Medicaid Other | Source: Ambulatory Visit | Attending: Family Medicine | Admitting: Family Medicine

## 2021-12-07 ENCOUNTER — Other Ambulatory Visit: Payer: Self-pay

## 2021-12-07 ENCOUNTER — Emergency Department (INDEPENDENT_AMBULATORY_CARE_PROVIDER_SITE_OTHER)
Admission: RE | Admit: 2021-12-07 | Discharge: 2021-12-07 | Disposition: A | Discharge status: Home / Self Care | Payer: Medicaid Other | Source: Ambulatory Visit

## 2021-12-07 ENCOUNTER — Ambulatory Visit: Payer: Medicaid Other

## 2021-12-07 VITALS — BP 113/76 | HR 87 | Temp 98.4°F | Resp 18 | Ht 67.0 in | Wt 204.0 lb

## 2021-12-07 DIAGNOSIS — Z7251 High risk heterosexual behavior: Secondary | ICD-10-CM | POA: Diagnosis not present

## 2021-12-07 DIAGNOSIS — Z113 Encounter for screening for infections with a predominantly sexual mode of transmission: Secondary | ICD-10-CM | POA: Diagnosis not present

## 2021-12-07 DIAGNOSIS — B3731 Acute candidiasis of vulva and vagina: Secondary | ICD-10-CM | POA: Insufficient documentation

## 2021-12-07 DIAGNOSIS — Z202 Contact with and (suspected) exposure to infections with a predominantly sexual mode of transmission: Secondary | ICD-10-CM

## 2021-12-07 DIAGNOSIS — N898 Other specified noninflammatory disorders of vagina: Secondary | ICD-10-CM | POA: Diagnosis present

## 2021-12-07 LAB — POCT URINE PREGNANCY: Preg Test, Ur: NEGATIVE

## 2021-12-07 MED ORDER — FLUCONAZOLE 150 MG PO TABS: ORAL_TABLET | ORAL | 0 refills | Status: DC

## 2021-12-07 NOTE — ED Provider Notes (Signed)
[facility]    CSN: [id number] Arrival date & time: 12/07/21  1437      History   Chief Complaint Chief Complaint  Patient presents with   Vaginal Itching    HPI Tonya Smith is a 26 y.o. female.   HPI 26 year old female presents with vaginal itching for 1 day.  Denies vaginal discharge.  Sent request to be tested for vaginal candidiasis bacterial vaginosis and STD due to new unprotected sexual partner 2 weeks ago.  PMH significant for anxiety and abnormal vaginal Pap smear.  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 Authorizing Provider  cholecalciferol (VITAMIN D3) 25 MCG (1000 UNIT) tablet Take 1,000 Units by mouth daily.   Yes [provider]  fluconazole (DIFLUCAN) 150 MG tablet Take 1 tab p.o. for vaginal candidiasis, may repeat 1 tab p.o. in 3 days if symptoms are not resolved. 12/07/21  Yes Trevor Iha, FNP  escitalopram (LEXAPRO) 10 MG tablet Take by mouth. 09/19/20   [provider]  polyethylene glycol powder (MIRALAX) 17 GM/SCOOP powder Take 17 g by mouth daily. 10/13/20   Elvina Sidle, MD  promethazine (PHENERGAN) 25 MG tablet  Take 1 tablet (25 mg total) by mouth every 6 (six) hours as needed for nausea or vomiting. 10/28/20   Donette Larry, CNM  famotidine (PEPCID) 20 MG tablet Take 1 tablet (20 mg total) by mouth 2 (two) times daily. Patient not taking: Reported on 04/17/2019 02/28/19 10/13/20  Allie Bossier, MD  Norethindrone-Ethinyl Estradiol-Fe (GENERESSE) 0.8-25 MG-MCG tablet Chew 1 tablet by mouth daily. 07/16/19 10/13/20  Allie Bossier, MD  pantoprazole (PROTONIX) 40 MG tablet Take 1 tablet (40 mg total) by mouth daily. Patient not taking: No sig reported 03/06/19 10/13/20  Leftwich-Kirby, Wilmer Floor, CNM    Family History Family History  Problem Relation Age of Onset   Hypertension Mother    Hyperlipidemia Father    Diabetes Father    Hypertension Father     Social History Social History   Tobacco Use   Smoking status: Former    Packs/day: 1.00    Types: Cigarettes   Smokeless tobacco: Never  Vaping Use   Vaping Use: Never used  Substance Use Topics   Alcohol use: Yes    Comment: occasionally   Drug use: No     Allergies   Patient has  no known allergies.   Review of Systems Review of Systems  Genitourinary:        Vaginal itching x2 days    Physical Exam Triage Vital Signs ED Triage Vitals  Enc Vitals Group     BP 12/07/21 1455 113/76     Pulse Rate 12/07/21 1455 87     Resp 12/07/21 1455 18     Temp 12/07/21 1455 98.4 F (36.9 C)     Temp Source 12/07/21 1455 Oral     SpO2 12/07/21 1455 100 %     Weight 12/07/21 1449 204 lb (92.5 kg)     Height 12/07/21 1449 5\' 7"  (1.702 m)     Head Circumference --      Peak Flow --      Pain Score 12/07/21 1449 0     Pain Loc --      Pain Edu? --      Excl. in GC? --    No data found.  Updated Vital Signs BP 113/76 (BP Location: Right Arm)    Pulse 87    Temp 98.4 F (36.9 C) (Oral)    Resp 18    Ht 5\' 7"  (1.702 m)    Wt 204 lb (92.5 kg)    LMP 11/16/2021 (Approximate)    SpO2 100%    BMI 31.95 kg/m      Physical Exam Vitals and  nursing note reviewed.  Constitutional:      General: She is not in acute distress.    Appearance: Normal appearance. She is obese. She is not ill-appearing, toxic-appearing or diaphoretic.  HENT:     Head: Normocephalic and atraumatic.     Mouth/Throat:     Mouth: Mucous membranes are moist.     Pharynx: Oropharynx is clear.  Eyes:     Extraocular Movements: Extraocular movements intact.     Conjunctiva/sclera: Conjunctivae normal.     Pupils: Pupils are equal, round, and reactive to light.  Cardiovascular:     Rate and Rhythm: Normal rate and regular rhythm.     Pulses: Normal pulses.     Heart sounds: Normal heart sounds.  Pulmonary:     Effort: Pulmonary effort is normal.     Breath sounds: Normal breath sounds.  Abdominal:     Tenderness: There is no right CVA tenderness or left CVA tenderness.  Musculoskeletal:     Cervical back: Normal range of motion and neck supple.  Skin:    General: Skin is warm and dry.  Neurological:     General: No focal deficit present.     Mental Status: She is alert and oriented to person, place, and time. Mental status is at baseline.     UC Treatments / Results  Labs (all labs ordered are listed, but only abnormal results are displayed) Labs Reviewed  HIV ANTIBODY (ROUTINE TESTING W REFLEX)  RPR  HEPATITIS C ANTIBODY  POCT URINE PREGNANCY  CERVICOVAGINAL ANCILLARY ONLY    EKG   Radiology No results found.  Procedures Procedures (including critical care time)  Medications Ordered in UC Medications - No data to display  Initial Impression / Assessment and Plan / UC Course  I have reviewed the triage vital signs and the nursing notes.  Pertinent labs & imaging results that were available during my care of the patient were reviewed by me and considered in my medical decision making (see chart for details).     MDM: 1.  Vaginal candidiasis-Aptima swab ordered, Rx'd Diflucan;  2.  Potential exposure to STD-Aptima swab ordered  for GC chlamydia and trichomonas, RPR, Hep C, HIV reflexes ordered. Advised patient take medication as directed with food to completion.  Advised we will follow-up with Aptima swab and serological results once returned. Advised patient take medication as directed with food to completion.  Encouraged patient to increase daily water intake while taking this medication.  Patient discharged home, hemodynamically stable. Final Clinical Impressions(s) / UC Diagnoses   Final diagnoses:  Vaginal candidiasis  Potential exposure to STD     Discharge Instructions      Advised patient take medication as directed with food to completion.  Advised we will follow-up with Aptima swab and serological results once returned.  Encouraged patient to increase daily water intake while taking this medication.     ED Prescriptions     Medication Sig Dispense Auth. Provider   fluconazole (DIFLUCAN) 150 MG tablet Take 1 tab p.o. for vaginal candidiasis, may repeat 1 tab p.o. in 3 days if symptoms are not resolved. 4 tablet Trevor Iha, FNP      PDMP not reviewed this encounter.   Trevor Iha, FNP 12/07/21 1555

## 2021-12-07 NOTE — Discharge Instructions (Addendum)
Advised patient take medication as directed with food to completion.  Advised we will follow-up with Aptima swab and serological results once returned.  Encouraged patient to increase daily water intake while taking this medication.

## 2021-12-07 NOTE — ED Triage Notes (Signed)
Pt presents to Urgent Care with c/o vaginal itching since yesterday. Denies pain or discharge. States she would like to be tested for yeast/bacteria and also STIs d/t new partner 2 weeks ago (unprotected sex).

## 2021-12-08 LAB — CERVICOVAGINAL ANCILLARY ONLY
Bacterial Vaginitis (gardnerella): POSITIVE — AB
Candida Glabrata: NEGATIVE
Candida Vaginitis: NEGATIVE
Chlamydia: NEGATIVE
Comment: NEGATIVE
Comment: NEGATIVE
Comment: NEGATIVE
Comment: NEGATIVE
Comment: NEGATIVE
Comment: NORMAL
Neisseria Gonorrhea: NEGATIVE
Trichomonas: NEGATIVE

## 2021-12-08 LAB — HIV ANTIBODY (ROUTINE TESTING W REFLEX): HIV 1&2 Ab, 4th Generation: NONREACTIVE

## 2021-12-08 LAB — HEPATITIS C ANTIBODY
Hepatitis C Ab: NONREACTIVE
SIGNAL TO CUT-OFF: <0.02 (ref <1.00)

## 2021-12-08 LAB — RPR: RPR Ser Ql: NONREACTIVE

## 2021-12-09 ENCOUNTER — Telehealth (HOSPITAL_COMMUNITY): Payer: Self-pay | Admitting: Emergency Medicine

## 2021-12-09 MED ORDER — METRONIDAZOLE 0.75 % VA GEL: 1.0000 | Freq: Every day | VAGINAL | 0 refills | Status: AC

## 2021-12-30 ENCOUNTER — Ambulatory Visit: Payer: Self-pay

## 2022-04-08 ENCOUNTER — Encounter: Payer: Self-pay | Admitting: Obstetrics & Gynecology

## 2022-04-16 NOTE — Progress Notes (Unsigned)
Last Mammogram: n/a Last Pap Smear:  04/12/18 Last Colon Screening;  n/a Seat Belts:   Yes Sun Screen:   Sometimes Dental Check Up:  Yes Brush & Floss:  Yes

## 2022-04-19 ENCOUNTER — Other Ambulatory Visit (HOSPITAL_COMMUNITY)
Admission: RE | Admit: 2022-04-19 | Discharge: 2022-04-19 | Disposition: A | Discharge status: Home / Self Care | Payer: Medicaid Other | Source: Ambulatory Visit | Attending: Obstetrics & Gynecology | Admitting: Obstetrics & Gynecology

## 2022-04-19 ENCOUNTER — Encounter: Payer: Self-pay | Admitting: Obstetrics & Gynecology

## 2022-04-19 ENCOUNTER — Ambulatory Visit (INDEPENDENT_AMBULATORY_CARE_PROVIDER_SITE_OTHER): Payer: Medicaid Other | Admitting: Obstetrics & Gynecology

## 2022-04-19 VITALS — BP 123/82 | HR 67 | Ht 67.0 in | Wt 207.0 lb

## 2022-04-19 DIAGNOSIS — N898 Other specified noninflammatory disorders of vagina: Secondary | ICD-10-CM | POA: Diagnosis not present

## 2022-04-19 DIAGNOSIS — Z113 Encounter for screening for infections with a predominantly sexual mode of transmission: Secondary | ICD-10-CM | POA: Diagnosis not present

## 2022-04-19 DIAGNOSIS — Z3009 Encounter for other general counseling and advice on contraception: Secondary | ICD-10-CM

## 2022-04-19 DIAGNOSIS — L299 Pruritus, unspecified: Secondary | ICD-10-CM | POA: Diagnosis not present

## 2022-04-19 DIAGNOSIS — A5901 Trichomonal vulvovaginitis: Secondary | ICD-10-CM | POA: Insufficient documentation

## 2022-04-19 DIAGNOSIS — Z0000 Encounter for general adult medical examination without abnormal findings: Secondary | ICD-10-CM

## 2022-04-19 NOTE — Progress Notes (Unsigned)
Subjective:     Tonya Smith is a 26 y.o. female here for a routine exam.  Current complaints: intermittent itching, some yellowish d/c.   Gynecologic History Patient's last menstrual period was 04/14/2022 (exact date). Contraception: coitus interruptus Last Mammogram: n/a Last Pap Smear:  06/2021 Last Colon Screening;  n/a Seat Belts:   Yes Sun Screen:   Sometimes Dental Check Up:  Yes Brush & Floss:  Yes   Obstetric History OB History  Gravida Para Term Preterm AB Living  1 1 0 1 0 1  SAB IAB Ectopic Multiple Live Births  0 0 0 0 1    # Outcome Date GA Lbr Len/2nd Weight Sex Delivery Anes PTL Lv  1 Preterm 06/15/19 [redacted]w[redacted]d 14:50 / 00:17 7 lb 6.5 oz (3.359 kg) M Vag-Spont EPI  LIV     Birth Comments: WNL     The following portions of the patient's history were reviewed and updated as appropriate: allergies, current medications, past family history, past medical history, past social history, past surgical history, and problem list.  Review of Systems Pertinent items noted in HPI and remainder of comprehensive ROS otherwise negative.    Objective:     Vitals:   04/19/22 1553  BP: 123/82  Pulse: 67  Weight: 207 lb (93.9 kg)  Height: 5\' 7"  (1.702 m)   Vitals:  WNL General appearance: alert, cooperative and no distress  HEENT: Normocephalic, without obvious abnormality, atraumatic Eyes: negative Throat: lips, mucosa, and tongue normal; teeth and gums normal  Respiratory: Clear to auscultation bilaterally  CV: Regular rate and rhythm  Breasts:  Normal appearance, no masses or tenderness, no nipple retraction or dimpling  GI: Soft, non-tender; bowel sounds normal; no masses,  no organomegaly  GU: External Genitalia:  Tanner V, no lesion Urethra:  No prolapse   Vagina: Pink, normal rugae, trace blood (just ending menses), small amount yellowish discharge  Cervix: No CMT, no lesion  Uterus:  Normal size and contour, non tender  Adnexa: Normal, no masses, non tender   Musculoskeletal: No edema, redness or tenderness in the calves or thighs  Skin: No lesions or rash  Lymphatic: Axillary adenopathy: none     Psychiatric: Normal mood and behavior        Assessment:    Healthy female exam.    Plan:    {plan:19193}

## 2022-04-20 ENCOUNTER — Encounter: Payer: Self-pay | Admitting: Obstetrics & Gynecology

## 2022-04-20 LAB — CERVICOVAGINAL ANCILLARY ONLY
Bacterial Vaginitis (gardnerella): NEGATIVE
Candida Glabrata: NEGATIVE
Candida Vaginitis: NEGATIVE
Chlamydia: NEGATIVE
Comment: NEGATIVE
Comment: NEGATIVE
Comment: NEGATIVE
Comment: NEGATIVE
Comment: NEGATIVE
Comment: NORMAL
Neisseria Gonorrhea: NEGATIVE
Trichomonas: POSITIVE — AB

## 2022-04-20 LAB — RPR: RPR Ser Ql: NONREACTIVE

## 2022-04-20 LAB — HEPATITIS C ANTIBODY: Hepatitis C Ab: NONREACTIVE

## 2022-04-20 LAB — HEPATITIS B SURFACE ANTIGEN: Hepatitis B Surface Ag: NONREACTIVE

## 2022-04-20 LAB — HIV ANTIBODY (ROUTINE TESTING W REFLEX): HIV 1&2 Ab, 4th Generation: NONREACTIVE

## 2022-04-20 MED ORDER — TRIAMCINOLONE ACETONIDE 0.5 % EX CREA: TOPICAL_CREAM | CUTANEOUS | 1 refills | Status: DC

## 2022-04-21 ENCOUNTER — Telehealth: Payer: Self-pay | Admitting: *Deleted

## 2022-04-21 ENCOUNTER — Encounter: Payer: Self-pay | Admitting: Obstetrics & Gynecology

## 2022-04-21 ENCOUNTER — Other Ambulatory Visit: Payer: Self-pay | Admitting: Obstetrics & Gynecology

## 2022-04-21 MED ORDER — METRONIDAZOLE 500 MG PO TABS: 500.0000 mg | ORAL_TABLET | Freq: Two times a day (BID) | ORAL | 0 refills | Status: DC

## 2022-04-21 NOTE — Telephone Encounter (Cosign Needed)
Pt is positive for Trich and per protocol Flagyl 500 mg BID was set to her pharmacy.

## 2022-04-21 NOTE — Telephone Encounter (Signed)
Patient informed of contacting the office to schedule TOC appointment 3-4 weeks after completion of medication. Patient stated that she might go to Urgent Care. Dr Penne Lash is okay with that plan as well.

## 2022-04-21 NOTE — Telephone Encounter (Signed)
-----   Message from Lesly Dukes, MD sent at 04/21/2022 11:44 AM EDT ----- Needs test of cure in 3-4 weeks.  RN visit is fine.

## 2022-06-19 IMAGING — DX DG ABDOMEN 1V
2 series · 2 of 2 positions shown · non-contrast
Comparison: None.

CLINICAL DATA: Crampy upper abdominal pain radiating to the back
beginning yesterday with nausea and vomiting.

EXAM:
ABDOMEN - 1 VIEW

[abdomen kub (1 of 2)]
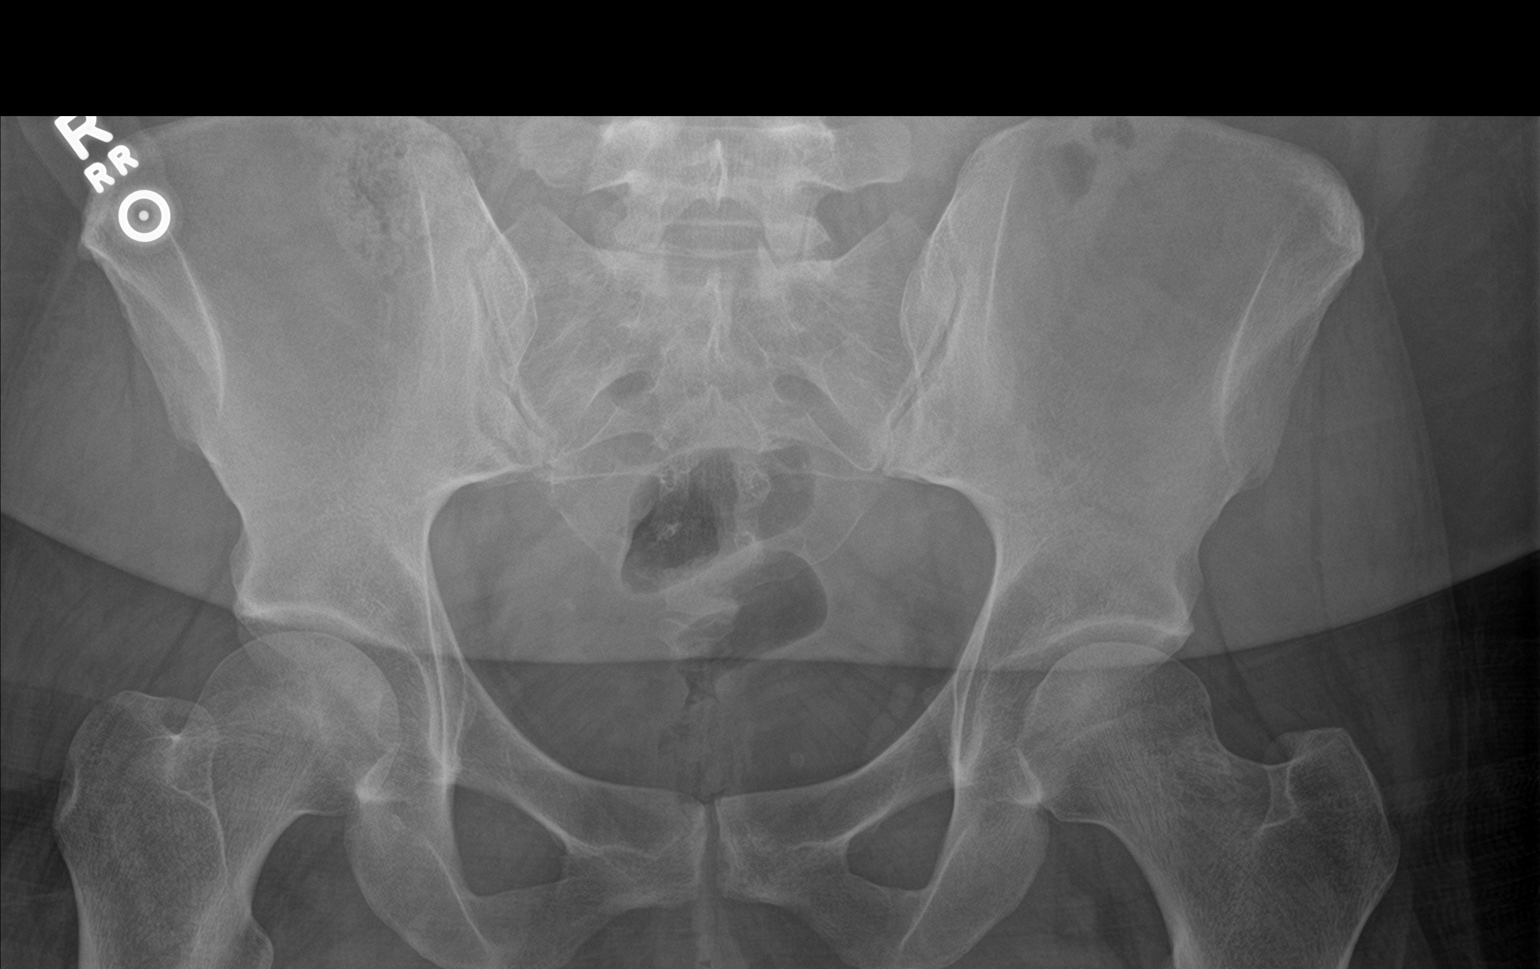

[abdomen kub (2 of 2)]
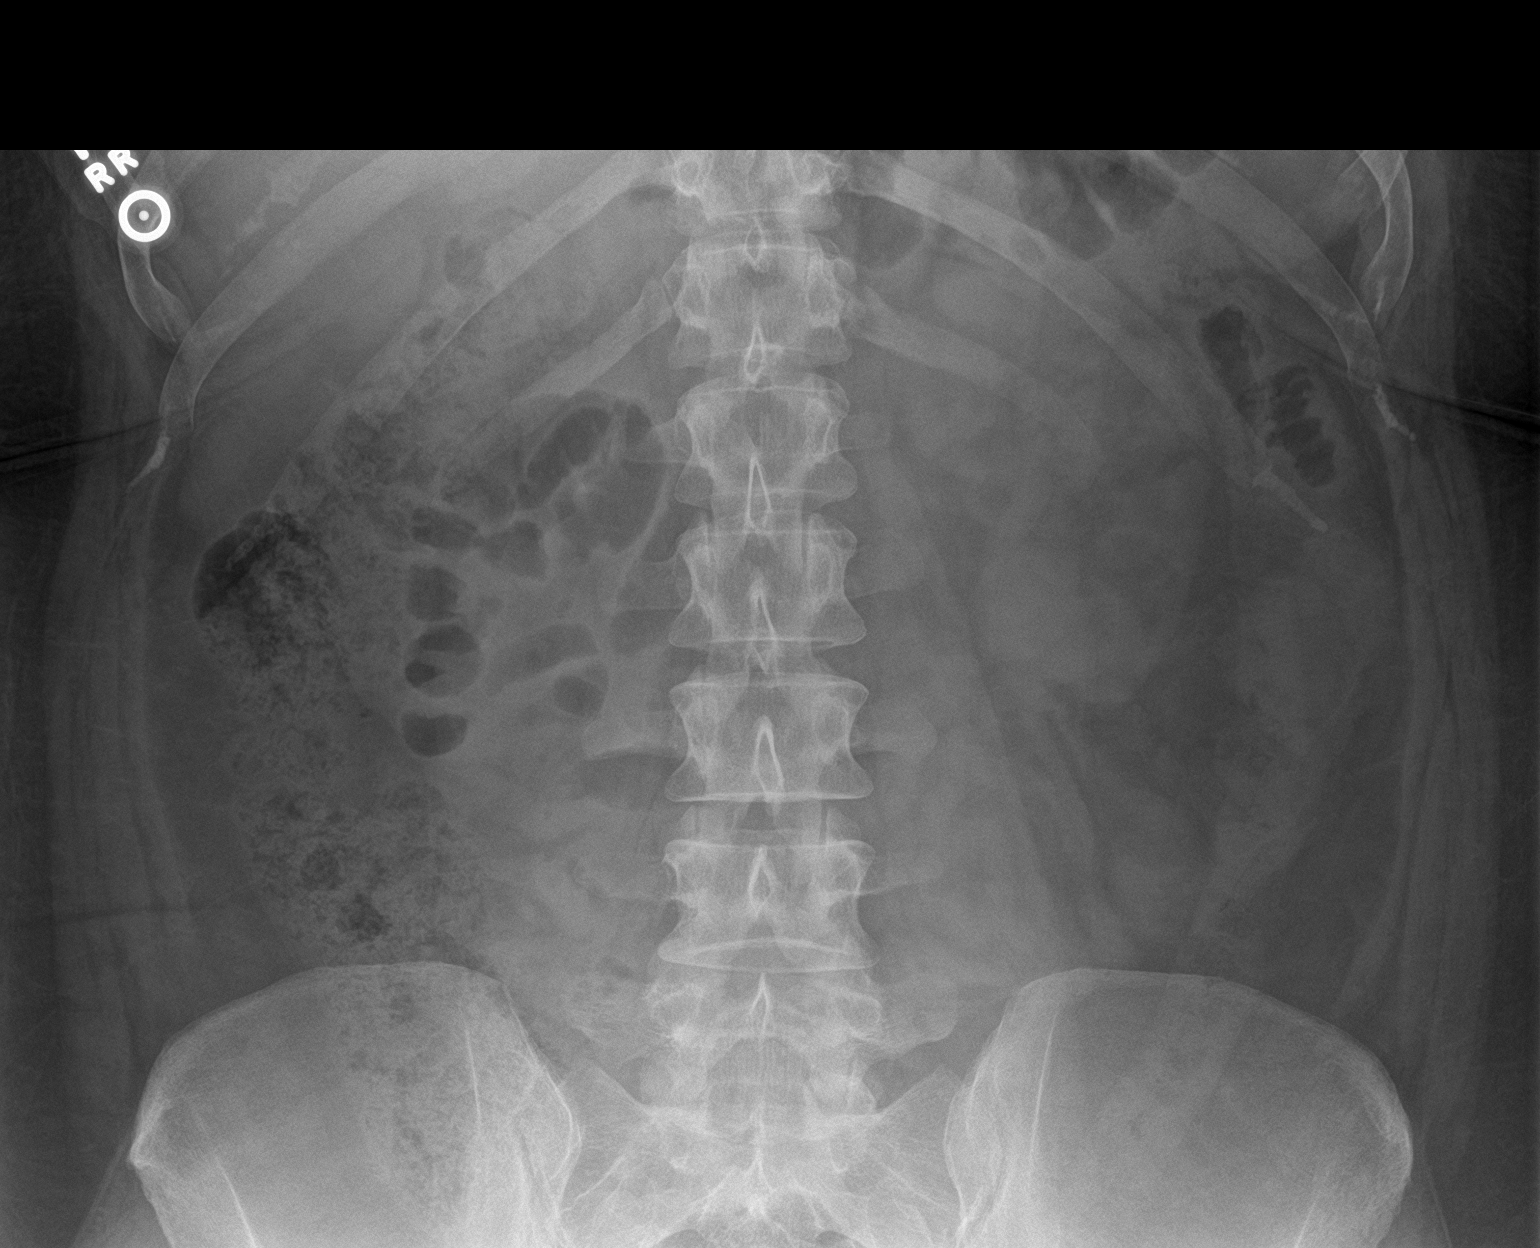

[2 of 2 positions shown; findings below may reference images not displayed]

FINDINGS: There is a moderate amount of stool in the ascending colon with gas
and a small amount of stool more distally. No dilated loops of bowel
are seen to suggest obstruction. 1 or 2 small calcifications in the
pelvis likely represent phleboliths. No definite renal calculi are
identified within limitations of overlying bowel/stool. No acute
osseous abnormality is seen.
IMPRESSION: Negative.

## 2022-08-18 ENCOUNTER — Ambulatory Visit
Admission: EM | Admit: 2022-08-18 | Discharge: 2022-08-18 | Disposition: A | Discharge status: Home / Self Care | Payer: Medicaid Other | Attending: Family Medicine | Admitting: Family Medicine

## 2022-08-18 DIAGNOSIS — J020 Streptococcal pharyngitis: Secondary | ICD-10-CM

## 2022-08-18 LAB — POCT RAPID STREP A (OFFICE): Rapid Strep A Screen: POSITIVE — AB

## 2022-08-18 MED ORDER — PENICILLIN G BENZATHINE 1200000 UNIT/2ML IM SUSY
1.2000 10*6.[IU] | PREFILLED_SYRINGE | Freq: Once | INTRAMUSCULAR | Status: AC
Start: 2022-08-18 — End: 2022-08-18
  Administered 2022-08-18: 1.2 10*6.[IU] via INTRAMUSCULAR

## 2022-08-18 NOTE — Discharge Instructions (Signed)
Take Tylenol or ibuprofen for pain and fever May use sore throat spray or lozenge Drink lots of fluids

## 2022-08-18 NOTE — ED Provider Notes (Signed)
[facility]    CSN: [id number] Arrival date & time: 08/18/22  1646      History   Chief Complaint Chief Complaint  Patient presents with   Fever   Sore Throat   Otalgia    Bilateral    HPI Tonya Smith is a 26 y.o. female.   HPI  Patient has fever.  Sore throat.  Headache and body aches.  Pressure in both ears.  No known exposure to illness.  COVID test at home was negative.  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 Authorizing Provider  sertraline (ZOLOFT) 100 MG tablet Take 100 mg by mouth daily.    [provider]  triamcinolone cream (KENALOG) 0.5 % Apply two times daily as needed 04/20/22   Lesly Dukes, MD  famotidine (PEPCID) 20 MG tablet Take 1 tablet (20 mg total) by mouth 2 (two) times daily. Patient not taking: Reported on 04/17/2019 02/28/19 10/13/20  Allie Bossier, MD  Norethindrone-Ethinyl Estradiol-Fe (GENERESSE) 0.8-25 MG-MCG tablet Chew 1 tablet by mouth daily. 07/16/19 10/13/20  Allie Bossier, MD  pantoprazole (PROTONIX) 40 MG tablet Take 1 tablet (40 mg total) by mouth daily. Patient not taking: No sig reported 03/06/19 10/13/20   Leftwich-Kirby, Wilmer Floor, CNM    Family History Family History  Problem Relation Age of Onset   Hypertension Mother    Hyperlipidemia Father    Diabetes Father    Hypertension Father     Social History Social History   Tobacco Use   Smoking status: Former    Packs/day: 1.00    Types: Cigarettes   Smokeless tobacco: Never  Vaping Use   Vaping Use: Never used  Substance Use Topics   Alcohol use: Yes    Comment: occasionally   Drug use: No     Allergies   Patient has no known allergies.   Review of Systems Review of Systems See HPI  Physical Exam Triage Vital Signs ED Triage Vitals [08/18/22 1650]  Enc Vitals Group     BP 139/81     Pulse Rate (!) 135     Resp 18     Temp 99.3 F (37.4 C)     Temp Source Oral     SpO2 99 %     Weight      Height  Head Circumference      Peak Flow      Pain Score 8     Pain Loc      Pain Edu?      Excl. in [place]?    No data found.  Updated Vital Signs BP 139/81 (BP Location: Right Arm)   Pulse (!) 135   Temp 99.3 F (37.4 C) (Oral)   Resp 18   SpO2 99%     Physical Exam Constitutional:      General: She is not in acute distress.    Appearance: She is well-developed.  HENT:     Head: Normocephalic and atraumatic.     Right Ear: Tympanic membrane and ear canal normal.     Left Ear: Tympanic membrane and ear canal normal.     Mouth/Throat:     Pharynx: Uvula midline. Posterior oropharyngeal erythema present. No pharyngeal swelling.     Tonsils: Tonsillar exudate present. 1+ on the right. 1+ on the left.     Comments: Tonsils been removed but there is residual tonsillar tissue that is hypertrophied, erythematous, with exudate Eyes:     Conjunctiva/sclera: Conjunctivae normal.     Pupils: Pupils are equal, round, and reactive to light.  Cardiovascular:     Rate and Rhythm: Normal rate and regular rhythm.     Heart sounds: Normal heart sounds.  Pulmonary:     Effort: Pulmonary effort is normal. No respiratory  distress.     Breath sounds: Normal breath sounds.  Abdominal:     General: There is no distension.     Palpations: Abdomen is soft.  Musculoskeletal:        General: Normal range of motion.     Cervical back: Normal range of motion.  Lymphadenopathy:     Cervical: Cervical adenopathy present.  Skin:    General: Skin is warm and dry.  Neurological:     Mental Status: She is alert.      UC Treatments / Results  Labs (all labs ordered are listed, but only abnormal results are displayed) Labs Reviewed  POCT RAPID STREP A (OFFICE) - Abnormal; Notable for the following components:      Result Value   Rapid Strep A Screen Positive (*)    All other components within normal limits    EKG   Radiology No results found.  Procedures Procedures (including critical care time)  Medications Ordered in UC Medications  penicillin g benzathine (BICILLIN LA) 1200000 UNIT/2ML injection 1.2 Million Units (1.2 Million Units Intramuscular Given 08/18/22 1721)    Initial Impression / Assessment and Plan / UC Course  I have reviewed the triage vital signs and the nursing notes.  Pertinent labs & imaging results that were available during my care of the patient were reviewed by me and considered in my medical decision making (see chart for details).     Patient states last time she had strep but did not get better with p.o. antibiotics and she ended up having a penicillin shot.  She states she feels awful and would rather have a shot today than a prolonged illness. Final Clinical Impressions(s) / UC Diagnoses   Final diagnoses:  Strep pharyngitis     Discharge Instructions      Take Tylenol or ibuprofen for pain and fever May use sore throat spray or lozenge Drink lots of fluids    ED Prescriptions   None    PDMP not reviewed this encounter.   Raylene Everts, MD 08/18/22  1733  

## 2022-08-18 NOTE — ED Triage Notes (Addendum)
Pt c/o fever, sore throat, and bilateral ear pain since last night. Tmax fever 101 at 2am. Taking sudafed and advil prn. COVID neg at home today.

## 2023-09-06 ENCOUNTER — Encounter: Payer: Self-pay | Admitting: Family Medicine

## 2023-09-06 ENCOUNTER — Ambulatory Visit (INDEPENDENT_AMBULATORY_CARE_PROVIDER_SITE_OTHER): Payer: Medicaid Other | Admitting: Family Medicine

## 2023-09-06 VITALS — BP 121/83 | HR 93 | Temp 98.4°F | Resp 16 | Ht 68.0 in | Wt 230.0 lb

## 2023-09-06 DIAGNOSIS — R635 Abnormal weight gain: Secondary | ICD-10-CM

## 2023-09-06 DIAGNOSIS — R5382 Chronic fatigue, unspecified: Secondary | ICD-10-CM | POA: Diagnosis not present

## 2023-09-06 DIAGNOSIS — R7302 Impaired glucose tolerance (oral): Secondary | ICD-10-CM | POA: Diagnosis not present

## 2023-09-06 DIAGNOSIS — Z7689 Persons encountering health services in other specified circumstances: Secondary | ICD-10-CM | POA: Diagnosis not present

## 2023-09-06 NOTE — Progress Notes (Signed)
New Patient Office Visit  Subjective    Patient ID: Tonya Smith, female    DOB: January 12, 1996  Age: 27 y.o. MRN: [id number]  CC:  Chief Complaint  Patient presents with   Establish Care    Weight loss    HPI Isaiah Torok presents to establish care. Pt is new to me.  She recently had a baby Sept 20th. She is here for weight loss assistance. She reports she isn't exercising as much. She has a 60 y.o child along with her 32 month old.  She does go outside and play with her son. She is drinking a few bottles of water a day. She is busy now and feels like she needs assistance with the weight loss. She reports she is fatigue more lately but not sure if this is due to the birth of her 2nd child. She has her 63 y.o son in day care. She isn't breastfeeding currently. Pt does report hx of impaired glucose during her pregnancy. She is currently taking Zoloft 100mg  daily for anxiety.  No allergies.    Outpatient Encounter Medications as of 09/06/2023  Medication Sig   sertraline (ZOLOFT) 100 MG tablet Take 100 mg by mouth daily.   triamcinolone cream (KENALOG) 0.5 % Apply two times daily as needed   Vitamin D, Ergocalciferol, (DRISDOL) 1.25 MG (50000 UNIT) CAPS capsule Take 50,000 Units by mouth every 7 (seven) days.   [DISCONTINUED] famotidine (PEPCID) 20 MG tablet Take 1 tablet (20 mg total) by mouth 2 (two) times daily. (Patient not taking: Reported on 04/17/2019)   [DISCONTINUED] Norethindrone-Ethinyl Estradiol-Fe (GENERESSE) 0.8-25 MG-MCG tablet Chew 1 tablet by mouth daily.   [DISCONTINUED] pantoprazole (PROTONIX) 40 MG tablet Take 1 tablet (40 mg total) by mouth daily. (Patient not taking: No sig reported)   No facility-administered encounter medications on file as of 09/06/2023.    Past Medical History:  Diagnosis Date   Anxiety    Vaginal Pap smear, abnormal     Past Surgical History:  Procedure Laterality Date   broken leg     screws and rods in L leg   TONSILLECTOMY      wisdom teeth removal      Family History  Problem Relation Age of Onset   Hypertension Mother    Hyperlipidemia Father    Diabetes Father    Hypertension Father     Social History   Socioeconomic History   Marital status: Single    Spouse name: Not on file   Number of children: Not on file   Years of education: Not on file   Highest education level: Not on file  Occupational History   Not on file  Tobacco Use   Smoking status: Former    Current packs/day: 1.00    Types: Cigarettes   Smokeless tobacco: Never  Vaping Use   Vaping status: Never Used  Substance and Sexual Activity   Alcohol use: Yes    Comment: occasionally   Drug use: No   Sexual activity: Yes  Other Topics Concern   Not on file  Social History Narrative   Not on file   Social Determinants of Health   Financial Resource Strain: Low Risk  (12/04/2021)   Received from [facility]   Overall Financial Resource Strain (CARDIA)    Difficulty of Paying Living Expenses: Not hard at all  Food Insecurity: No Food Insecurity (07/22/2023)   Received from [facility]   Hunger Vital Sign    Worried About  Running Out of Food in the Last Year: Never true    Ran Out of Food in the Last Year: Never true  Transportation Needs: No Transportation Needs (07/22/2023)   Received from [facility] - Transportation    Lack of Transportation (Medical): No    Lack of Transportation (Non-Medical): No  Physical Activity: Insufficiently Active (12/04/2021)   Received from [facility]   Exercise Vital Sign    Days of Exercise per Week: 4 days    Minutes of Exercise per Session: 30 min  Stress: No Stress Concern Present (07/22/2023)   Received from [facility] - Occupational Stress Questionnaire    Feeling of Stress : Not at all  Social Connections: Socially Integrated (11/05/2022)   Received from [facility]   Social Network    How  would you rate your social network (family, work, friends)?: Good participation with social networks  Intimate Partner Violence: Not At Risk (07/22/2023)   Received from [facility]   Humiliation, Afraid, Rape, and Kick questionnaire    Fear of Current or Ex-Partner: No    Emotionally Abused: No    Physically Abused: No    Sexually Abused: No    Review of Systems  Constitutional:  Positive for malaise/fatigue.       Weight gain  All other systems reviewed and are negative.      Objective    BP 121/83   Pulse 93   Temp 98.4 F (36.9 C) (Oral)   Resp 16   Ht 5\' 8"  (1.727 m)   Wt 230 lb (104.3 kg)   SpO2 99%   BMI 34.97 kg/m   Physical Exam Vitals and nursing note reviewed.  Constitutional:      Appearance: Normal appearance. She is normal weight.  HENT:     Head: Normocephalic and atraumatic.     Right Ear: External ear normal.     Left Ear: External ear normal.     Nose: Nose normal.     Mouth/Throat:     Mouth: Mucous membranes are moist.     Pharynx: Oropharynx is clear.  Eyes:     Conjunctiva/sclera: Conjunctivae normal.     Pupils: Pupils are equal, round, and reactive to light.  Cardiovascular:     Rate and Rhythm: Normal rate.  Pulmonary:     Effort: Pulmonary effort is normal.  Skin:    General: Skin is warm.     Capillary Refill: Capillary refill takes less than 2 seconds.  Neurological:     General: No focal deficit present.     Mental Status: She is alert and oriented to person, place, and time. Mental status is at baseline.  Psychiatric:        Mood and Affect: Mood normal.        Behavior: Behavior normal.        Thought Content: Thought content normal.        Judgment: Judgment normal.       Assessment & Plan:   Problem List Items Addressed This Visit   None Encounter to establish care with new doctor  Weight gain -     CBC with Differential/Platelet -     Comprehensive metabolic panel -     TSH -     T4, free  Chronic  fatigue -     CBC with Differential/Platelet -     Comprehensive metabolic panel -  TSH -     T4, free -     VITAMIN D 25 Hydroxy (Vit-D Deficiency, Fractures) -     Vitamin B12  Impaired glucose tolerance -     Hemoglobin A1c   Screening metabolic labs to rule out diabetes or thyroid disorder Pt with hx of vitamin D deficiency. No longer taking supplements. Will recheck Vitamin D and B12 levels due to fatigue. Handout given today regarding calorie counting for weight loss. Plan to see back in 4 weeks.   No follow-ups on file.   Suzan Slick, MD

## 2023-09-07 ENCOUNTER — Encounter: Payer: Self-pay | Admitting: Family Medicine

## 2023-09-07 ENCOUNTER — Other Ambulatory Visit: Payer: Self-pay | Admitting: Family Medicine

## 2023-09-07 DIAGNOSIS — R7302 Impaired glucose tolerance (oral): Secondary | ICD-10-CM

## 2023-09-07 DIAGNOSIS — E559 Vitamin D deficiency, unspecified: Secondary | ICD-10-CM

## 2023-09-07 DIAGNOSIS — R635 Abnormal weight gain: Secondary | ICD-10-CM

## 2023-09-07 LAB — CBC WITH DIFFERENTIAL/PLATELET
Basophils Absolute: 0.1 10*3/uL (ref 0.0–0.2)
Basos: 1 %
EOS (ABSOLUTE): 0.3 10*3/uL (ref 0.0–0.4)
Eos: 3 %
Hematocrit: 36.7 % (ref 34.0–46.6)
Hemoglobin: 11.9 g/dL (ref 11.1–15.9)
Immature Grans (Abs): 0.1 10*3/uL (ref 0.0–0.1)
Immature Granulocytes: 1 %
Lymphocytes Absolute: 4 10*3/uL — ABNORMAL HIGH (ref 0.7–3.1)
Lymphs: 37 %
MCH: 26.7 pg (ref 26.6–33.0)
MCHC: 32.4 g/dL (ref 31.5–35.7)
MCV: 83 fL (ref 79–97)
Monocytes Absolute: 0.6 10*3/uL (ref 0.1–0.9)
Monocytes: 5 %
Neutrophils Absolute: 5.9 10*3/uL (ref 1.4–7.0)
Neutrophils: 53 %
Platelets: 322 10*3/uL (ref 150–450)
RBC: 4.45 x10E6/uL (ref 3.77–5.28)
RDW: 15.3 % (ref 11.7–15.4)
WBC: 10.9 10*3/uL — ABNORMAL HIGH (ref 3.4–10.8)

## 2023-09-07 LAB — COMPREHENSIVE METABOLIC PANEL
ALT: 45 [IU]/L — ABNORMAL HIGH (ref 0–32)
AST: 25 [IU]/L (ref 0–40)
Albumin: 4.3 g/dL (ref 4.0–5.0)
Alkaline Phosphatase: 80 [IU]/L (ref 44–121)
BUN/Creatinine Ratio: 14 (ref 9–23)
BUN: 12 mg/dL (ref 6–20)
Bilirubin Total: <0.2 mg/dL (ref 0.0–1.2)
CO2: 20 mmol/L (ref 20–29)
Calcium: 9.1 mg/dL (ref 8.7–10.2)
Chloride: 103 mmol/L (ref 96–106)
Creatinine, Ser: 0.86 mg/dL (ref 0.57–1.00)
Globulin, Total: 2.7 g/dL (ref 1.5–4.5)
Glucose: 114 mg/dL — ABNORMAL HIGH (ref 70–99)
Potassium: 4.2 mmol/L (ref 3.5–5.2)
Sodium: 142 mmol/L (ref 134–144)
Total Protein: 7 g/dL (ref 6.0–8.5)
eGFR: 94 mL/min/{1.73_m2} (ref >59)

## 2023-09-07 LAB — VITAMIN B12: Vitamin B-12: 681 pg/mL (ref 232–1245)

## 2023-09-07 LAB — VITAMIN D 25 HYDROXY (VIT D DEFICIENCY, FRACTURES): Vit D, 25-Hydroxy: 22.3 ng/mL — ABNORMAL LOW (ref 30.0–100.0)

## 2023-09-07 LAB — HEMOGLOBIN A1C
Est. average glucose Bld gHb Est-mCnc: 123 mg/dL
Hgb A1c MFr Bld: 5.9 % — ABNORMAL HIGH (ref 4.8–5.6)

## 2023-09-07 LAB — TSH: TSH: 0.844 u[IU]/mL (ref 0.450–4.500)

## 2023-09-07 LAB — T4, FREE: Free T4: 0.95 ng/dL (ref 0.82–1.77)

## 2023-09-07 MED ORDER — VITAMIN D (ERGOCALCIFEROL) 1.25 MG (50000 UNIT) PO CAPS: 50000.0000 [IU] | ORAL_CAPSULE | ORAL | 1 refills | Status: DC

## 2023-09-07 MED ORDER — SEMAGLUTIDE(0.25 OR 0.5MG/DOS) 2 MG/3ML ~~LOC~~ SOPN: 0.2500 mg | PEN_INJECTOR | SUBCUTANEOUS | 2 refills | Status: DC

## 2023-09-13 ENCOUNTER — Telehealth: Payer: Self-pay | Admitting: Family Medicine

## 2023-09-13 NOTE — Telephone Encounter (Signed)
If you can just explain this to the patient. I told her I would relay the message as I didn't handle the insurance cards etc.

## 2023-09-13 NOTE — Telephone Encounter (Signed)
Tonya Smith, please see message sent to me from patient. I see she only has Medicaid listed under primary coverage but there is another insurance card scanned in by you; not on her profile. Can you fix so I can resend her medication?  Hey, sorry to bother you.  I believe that they didn't put my insurance on file. So I don't think the prior authorization is going to work. I got a bill from my visit, so who should I call about getting my insurance on file. The girl at the front took a copy but I guess it didn't go in?

## 2023-09-13 NOTE — Telephone Encounter (Signed)
When this patient came in to be seen, she provided her new [facility] Medicaid card. That [employer] card is a [employer]. That insurance is on file and is active. The coverage for the [employer] Medicaid was removed at the visit per the patient's MyChart indicator. However, the patient also indicated on MyChart with her e-check in that she did not want to bill insurance. This can be verified by the appointment contact registration. I confirmed that with her verbally at the time of check-in that she did not want to bill insurance, as it is standard procedure to confirm DNBI/Self-Pay along with guarantor.   [facility] Medicaid is on file and active if it is needed. Regarding the visit, I am able to remove DNBI status and add the coverage but, as it has already been billed, she may need to contact billing to resubmit the claim. Katha Hamming

## 2023-09-22 ENCOUNTER — Telehealth: Payer: Self-pay

## 2023-09-22 MED ORDER — METFORMIN HCL ER 500 MG PO TB24: 500.0000 mg | ORAL_TABLET | Freq: Every day | ORAL | 1 refills | Status: DC

## 2023-09-22 NOTE — Telephone Encounter (Addendum)
Initiated Prior authorization UJW:[id number] (0.25 or 0.5 MG/DOSE) 2MG /3ML pen-injector Via: Covermymeds Case/Key:[id number] Status: denied as of 09/22/23 Reason:Per your health plan's criteria, this drug is covered if you meet the following: One of the following: (A) You have tried and failed or did not respond fully to metformin containing drug. (B) You cannot use metformin. The information provided does not show that you meet the criteria listed above. Notified Pt via: Mychart

## 2023-11-04 ENCOUNTER — Ambulatory Visit (INDEPENDENT_AMBULATORY_CARE_PROVIDER_SITE_OTHER): Payer: Medicaid Other | Admitting: Family Medicine

## 2023-11-04 VITALS — BP 107/70 | HR 83 | Temp 97.6°F | Resp 18 | Ht 68.0 in | Wt 233.6 lb

## 2023-11-04 DIAGNOSIS — R635 Abnormal weight gain: Secondary | ICD-10-CM | POA: Diagnosis not present

## 2023-11-04 DIAGNOSIS — R7303 Prediabetes: Secondary | ICD-10-CM | POA: Diagnosis not present

## 2023-11-04 DIAGNOSIS — Z6835 Body mass index (BMI) 35.0-35.9, adult: Secondary | ICD-10-CM

## 2023-11-04 MED ORDER — SEMAGLUTIDE(0.25 OR 0.5MG/DOS) 2 MG/3ML ~~LOC~~ SOPN: 0.2500 mg | PEN_INJECTOR | SUBCUTANEOUS | 2 refills | Status: DC

## 2023-11-04 NOTE — Progress Notes (Signed)
 Established Patient Office Visit  Subjective   Patient ID: Tonya Smith, female    DOB: [birth year]  Age: 28 y.o. MRN: [id number]  Chief Complaint  Patient presents with   Medical Management of Chronic Issues    Patient is here to discuss medication  ( Metformin )  she states that the medication is not working    HPI  Weight management Pt has been trying with lifestyle changes. She has gone back to work and meal prepping. She is also drinking water daily. She reports the Metformin  started on 11/21 didn't help with weight loss despite lifestyle changes. She's been compliant and taking this for the last 6 weeks. She has gained 3 lbs. She is wanting to try another medicine for weight loss.   Review of Systems  Constitutional:        Weight gain  All other systems reviewed and are negative.     Objective:     BP 107/70   Pulse 83   Temp 97.6 F (36.4 C) (Oral)   Resp 18   Ht 5' 8 (1.727 m)   Wt 233 lb 9.6 oz (106 kg)   SpO2 97%   BMI 35.52 kg/m  BP Readings from Last 3 Encounters:  11/04/23 107/70  09/06/23 121/83  08/18/22 139/81      Physical Exam Vitals and nursing note reviewed.  Constitutional:      Appearance: Normal appearance. She is normal weight.  HENT:     Head: Normocephalic and atraumatic.     Right Ear: External ear normal.     Left Ear: External ear normal.     Nose: Nose normal.     Mouth/Throat:     Mouth: Mucous membranes are moist.     Pharynx: Oropharynx is clear.  Eyes:     Conjunctiva/sclera: Conjunctivae normal.     Pupils: Pupils are equal, round, and reactive to light.  Cardiovascular:     Rate and Rhythm: Normal rate.  Pulmonary:     Effort: Pulmonary effort is normal.  Skin:    General: Skin is warm.     Capillary Refill: Capillary refill takes less than 2 seconds.  Neurological:     General: No focal deficit present.     Mental Status: She is alert and oriented to person, place, and time. Mental status is at baseline.   Psychiatric:        Mood and Affect: Mood normal.        Behavior: Behavior normal.        Thought Content: Thought content normal.        Judgment: Judgment normal.     No results found for any visits on 11/04/23.  Last hemoglobin A1c Lab Results  Component Value Date   HGBA1C 5.9 (H) 09/06/2023      The ASCVD Risk score (Arnett DK, et al., 2019) failed to calculate for the following reasons:   The 2019 ASCVD risk score is only valid for ages 63 to 63    Assessment & Plan:   Problem List Items Addressed This Visit   None Visit Diagnoses       Weight gain    -  Primary   Relevant Medications   Semaglutide ,0.25 or 0.5MG /DOS, 2 MG/3ML SOPN     BMI 35.0-35.9,adult       Relevant Medications   Semaglutide ,0.25 or 0.5MG /DOS, 2 MG/3ML SOPN     Prediabetes       Relevant Medications   Semaglutide ,0.25 or 0.5MG /DOS, 2  MG/3ML SOPN     Weight gain -     Semaglutide (0.25 or 0.5MG /DOS); Inject 0.25 mg into the skin once a week.  Dispense: 3 mL; Refill: 2  BMI 35.0-35.9,adult -     Semaglutide (0.25 or 0.5MG /DOS); Inject 0.25 mg into the skin once a week.  Dispense: 3 mL; Refill: 2  Prediabetes -     Semaglutide (0.25 or 0.5MG /DOS); Inject 0.25 mg into the skin once a week.  Dispense: 3 mL; Refill: 2   Due to Metformin  being ineffective and not controlling weight. Will send in Semaglutide  0.25mg  weekly  No follow-ups on file.    Torrence CINDERELLA Barrier, MD

## 2023-11-15 ENCOUNTER — Encounter: Payer: Self-pay | Admitting: Family Medicine

## 2023-11-18 NOTE — Telephone Encounter (Signed)
Copied from CRM [id number]. Topic: Clinical - Prescription Issue >> Nov 18, 2023 10:53 AM Shelah Lewandowsky wrote: Reason for CRM: Still waiting for prior auth for Ozempic, please call patient [phone]

## 2023-11-24 ENCOUNTER — Telehealth: Payer: Self-pay

## 2023-11-24 NOTE — Telephone Encounter (Signed)
Initiated Prior authorization ZOX:[id number] (0.25 or 0.5 MG/DOSE) 2MG /3ML pen-injectors Via: Covermymeds Case/Key:[id number] Status: Pending as of 11/24/23 Reason: Notified Pt via: Mychart

## 2023-12-16 ENCOUNTER — Encounter: Payer: Medicaid Other | Admitting: Family Medicine

## 2023-12-16 ENCOUNTER — Ambulatory Visit: Payer: Medicaid Other | Admitting: Family Medicine

## 2023-12-16 ENCOUNTER — Encounter: Payer: Self-pay | Admitting: Family Medicine

## 2023-12-16 NOTE — Progress Notes (Signed)
Rescheduled audio didn't work

## 2023-12-20 ENCOUNTER — Other Ambulatory Visit: Payer: Self-pay | Admitting: Family Medicine

## 2023-12-20 DIAGNOSIS — E559 Vitamin D deficiency, unspecified: Secondary | ICD-10-CM

## 2023-12-23 ENCOUNTER — Ambulatory Visit (INDEPENDENT_AMBULATORY_CARE_PROVIDER_SITE_OTHER): Payer: Medicaid Other | Admitting: Family Medicine

## 2023-12-23 ENCOUNTER — Encounter: Payer: Self-pay | Admitting: Family Medicine

## 2023-12-23 DIAGNOSIS — R635 Abnormal weight gain: Secondary | ICD-10-CM | POA: Diagnosis not present

## 2023-12-23 DIAGNOSIS — R7303 Prediabetes: Secondary | ICD-10-CM | POA: Diagnosis not present

## 2023-12-23 DIAGNOSIS — Z6835 Body mass index (BMI) 35.0-35.9, adult: Secondary | ICD-10-CM

## 2023-12-23 MED ORDER — SEMAGLUTIDE(0.25 OR 0.5MG/DOS) 2 MG/3ML ~~LOC~~ SOPN: 0.5000 mg | PEN_INJECTOR | SUBCUTANEOUS | Status: DC

## 2023-12-23 NOTE — Progress Notes (Signed)
   Established Patient Office Visit  Subjective   Patient ID: Tonya Smith, female    DOB: 08/15/1996  Age: 28 y.o. MRN: [id number]  Chief Complaint  Patient presents with   medication follow up    Patient is here for a follow up for medication Ozempic, Patient states that she does not have any issues or concerns with the medication right now.     HPI  Weight management Pt has been on Semaglutide 0.25mg  weekly for the last month. She no longer is taking Metformin as this was unsuccessful with weight control. Tolerating injection well. Had some constipation at first but this has subsided. She is drinking plenty of water. Has lost 7 lbs since starting injection.    Review of Systems  Constitutional:  Positive for weight loss.  All other systems reviewed and are negative.    Objective:     BP 103/70   Pulse 86   Temp 97.6 F (36.4 C) (Oral)   Resp 18   Ht 5\' 8"  (1.727 m)   Wt 226 lb 6.4 oz (102.7 kg)   SpO2 98%   BMI 34.42 kg/m  BP Readings from Last 3 Encounters:  12/23/23 103/70  11/04/23 107/70  09/06/23 121/83      Physical Exam Vitals and nursing note reviewed.  Constitutional:      Appearance: Normal appearance. She is normal weight.  HENT:     Head: Normocephalic and atraumatic.     Right Ear: External ear normal.     Left Ear: External ear normal.     Nose: Nose normal.  Eyes:     Extraocular Movements: Extraocular movements intact.  Cardiovascular:     Rate and Rhythm: Normal rate.  Pulmonary:     Effort: Pulmonary effort is normal.  Skin:    Capillary Refill: Capillary refill takes less than 2 seconds.  Neurological:     General: No focal deficit present.     Mental Status: She is alert and oriented to person, place, and time. Mental status is at baseline.  Psychiatric:        Mood and Affect: Mood normal.        Behavior: Behavior normal.        Thought Content: Thought content normal.        Judgment: Judgment normal.    No results found  for any visits on 12/23/23.  Last hemoglobin A1c Lab Results  Component Value Date   HGBA1C 5.9 (H) 09/06/2023      The ASCVD Risk score (Arnett DK, et al., 2019) failed to calculate for the following reasons:   The 2019 ASCVD risk score is only valid for ages 46 to 27    Assessment & Plan:   Problem List Items Addressed This Visit   None Visit Diagnoses       Weight gain       Relevant Medications   Semaglutide,0.25 or 0.5MG /DOS, 2 MG/3ML SOPN     BMI 35.0-35.9,adult       Relevant Medications   Semaglutide,0.25 or 0.5MG /DOS, 2 MG/3ML SOPN     Prediabetes       Relevant Medications   Semaglutide,0.25 or 0.5MG /DOS, 2 MG/3ML SOPN      To increase the Semaglutide from 0.25mg  to 0.5 mg weekly. See back in 4 weeks.   No follow-ups on file.    Suzan Slick, MD

## 2024-01-01 ENCOUNTER — Telehealth: Admitting: Physician Assistant

## 2024-01-01 DIAGNOSIS — T3695XA Adverse effect of unspecified systemic antibiotic, initial encounter: Secondary | ICD-10-CM | POA: Diagnosis not present

## 2024-01-01 DIAGNOSIS — B379 Candidiasis, unspecified: Secondary | ICD-10-CM | POA: Diagnosis not present

## 2024-01-01 MED ORDER — FLUCONAZOLE 150 MG PO TABS: 150.0000 mg | ORAL_TABLET | Freq: Once | ORAL | 0 refills | Status: AC

## 2024-01-01 NOTE — Progress Notes (Signed)
 E-Visit for Vaginal Symptoms ? ?We are sorry that you are not feeling well. Here is how we plan to help! ?Based on what you shared with me it looks like you: May have a yeast vaginosis ? ?Vaginosis is an inflammation of the vagina that can result in discharge, itching and pain. The cause is usually a change in the normal balance of vaginal bacteria or an infection. Vaginosis can also result from reduced estrogen levels after menopause. ? ?The most common causes of vaginosis are: ? ? Bacterial vaginosis which results from an overgrowth of one on several organisms that are normally present in your vagina. ? ? Yeast infections which are caused by a naturally occurring fungus called candida. ? ? Vaginal atrophy (atrophic vaginosis) which results from the thinning of the vagina from reduced estrogen levels after menopause. ? ? Trichomoniasis which is caused by a parasite and is commonly transmitted by sexual intercourse. ? ?Factors that increase your risk of developing vaginosis include: ?Medications, such as antibiotics and steroids ?Uncontrolled diabetes ?Use of hygiene products such as bubble bath, vaginal spray or vaginal deodorant ?Douching ?Wearing damp or tight-fitting clothing ?Using an intrauterine device (IUD) for birth control ?Hormonal changes, such as those associated with pregnancy, birth control pills or menopause ?Sexual activity ?Having a sexually transmitted infection ? ?Your treatment plan is A single Diflucan (fluconazole) 150mg  tablet once.  I have electronically sent this prescription into the pharmacy that you have chosen. ? ?Be sure to take all of the medication as directed. Stop taking any medication if you develop a rash, tongue swelling or shortness of breath. Mothers who are breast feeding should consider pumping and discarding their breast milk while on these antibiotics. However, there is no consensus that infant exposure at these doses would be harmful.  ?Remember that medication creams can  weaken latex condoms. ?. ? ? ?HOME CARE: ? ?Good hygiene may prevent some types of vaginosis from recurring and may relieve some symptoms: ? ?Avoid baths, hot tubs and whirlpool spas. Rinse soap from your outer genital area after a shower, and dry the area well to prevent irritation. Don't use scented or harsh soaps, such as those with deodorant or antibacterial action. ?Avoid irritants. These include scented tampons and pads. ?Wipe from front to back after using the toilet. Doing so avoids spreading fecal bacteria to your vagina. ? ?Other things that may help prevent vaginosis include: ? ?Don't douche. Your vagina doesn't require cleansing other than normal bathing. Repetitive douching disrupts the normal organisms that reside in the vagina and can actually increase your risk of vaginal infection. Douching won't clear up a vaginal infection. ?Use a latex condom. Both female and female latex condoms may help you avoid infections spread by sexual contact. ?Wear cotton underwear. Also wear pantyhose with a cotton crotch. If you feel comfortable without it, skip wearing underwear to bed. Yeast thrives in moist environments ?Your symptoms should improve in the next day or two. ? ?GET HELP RIGHT AWAY IF: ? ?You have pain in your lower abdomen ( pelvic area or over your ovaries) ?You develop nausea or vomiting ?You develop a fever ?Your discharge changes or worsens ?You have persistent pain with intercourse ?You develop shortness of breath, a rapid pulse, or you faint. ? ?These symptoms could be signs of problems or infections that need to be evaluated by a medical provider now. ? ?MAKE SURE YOU  ? ?Understand these instructions. ?Will watch your condition. ?Will get help right away if you are not  doing well or get worse. ? ?Thank you for choosing an e-visit. ? ?Your e-visit answers were reviewed by a board certified advanced clinical practitioner to complete your personal care plan. Depending upon the condition, your plan  could have included both over the counter or prescription medications. ? ?Please review your pharmacy choice. Make sure the pharmacy is open so you can pick up prescription now. If there is a problem, you may contact your provider through [employer] and have the prescription routed to another pharmacy.  Your safety is important to [place]. If you have drug allergies check your prescription carefully.  ? ?For the next 24 hours you can use MyChart to ask questions about today's visit, request a non-urgent call back, or ask for a work or school excuse. ?You will get an email in the next two days asking about your experience. I hope that your e-visit has been valuable and will speed your recovery. ?I have spent 5 minutes in review of e-visit questionnaire, review and updating patient chart, medical decision making and response to patient.  ? ?Sander Speckman S Mayers, PA-C ? ? ? ?

## 2024-01-13 ENCOUNTER — Ambulatory Visit: Payer: Medicaid Other | Admitting: Family Medicine

## 2024-01-13 ENCOUNTER — Encounter: Payer: Self-pay | Admitting: Family Medicine

## 2024-01-13 VITALS — BP 106/71 | HR 75 | Temp 97.6°F | Resp 18 | Ht 68.0 in | Wt 216.6 lb

## 2024-01-13 DIAGNOSIS — Z6832 Body mass index (BMI) 32.0-32.9, adult: Secondary | ICD-10-CM | POA: Diagnosis not present

## 2024-01-13 DIAGNOSIS — H6690 Otitis media, unspecified, unspecified ear: Secondary | ICD-10-CM | POA: Diagnosis not present

## 2024-01-13 DIAGNOSIS — R635 Abnormal weight gain: Secondary | ICD-10-CM

## 2024-01-13 DIAGNOSIS — R7303 Prediabetes: Secondary | ICD-10-CM

## 2024-01-13 MED ORDER — AMOXICILLIN-POT CLAVULANATE 875-125 MG PO TABS
1.0000 | ORAL_TABLET | Freq: Two times a day (BID) | ORAL | 0 refills | Status: DC
Start: 2024-01-13 — End: 2024-02-10

## 2024-01-13 MED ORDER — SEMAGLUTIDE(0.25 OR 0.5MG/DOS) 2 MG/3ML ~~LOC~~ SOPN: 0.5000 mg | PEN_INJECTOR | SUBCUTANEOUS | 1 refills | Status: DC

## 2024-01-13 NOTE — Progress Notes (Signed)
 Established Patient Office Visit  Subjective   Patient ID: Tonya Smith, female    DOB: 07/21/1996  Age: 28 y.o. MRN: [id number]  Chief Complaint  Patient presents with   Medical Management of Chronic Issues    Patient is here for a 1 month follow up for weight management she also would like her left ear looked at she states that it is completely stopped up     HPI  Weight management Pt started on Semaglutide 0.25mg  weekly and has been titrated up to 0.5 mg weekly. Doing well with regimen for weight gain, BMI 35 initially, and prediabetes. Denies constipation or other effects from medicine. 10 lbs lost since last visit 4 weeks ago. Needs refills of this.   Left ear pain Pt reports she had flu last week. Got better but developed left ear feeling stopped up in the last 3 days. Some pain but denies hearing loss.   Review of Systems  HENT:  Positive for ear pain.   All other systems reviewed and are negative.    Objective:     BP 106/71   Pulse 75   Temp 97.6 F (36.4 C) (Oral)   Resp 18   Ht 5\' 8"  (1.727 m)   Wt 216 lb 9.6 oz (98.2 kg)   SpO2 98%   BMI 32.93 kg/m  BP Readings from Last 3 Encounters:  01/13/24 106/71  12/23/23 103/70  11/04/23 107/70      Physical Exam Vitals and nursing note reviewed.  Constitutional:      Appearance: Normal appearance. She is normal weight.  HENT:     Head: Normocephalic and atraumatic.     Right Ear: Tympanic membrane, ear canal and external ear normal.     Left Ear: External ear normal.     Ears:     Comments: Left TM erythematous with effusion    Nose: Nose normal.     Mouth/Throat:     Mouth: Mucous membranes are moist.     Pharynx: Oropharynx is clear.  Eyes:     Conjunctiva/sclera: Conjunctivae normal.     Pupils: Pupils are equal, round, and reactive to light.  Cardiovascular:     Rate and Rhythm: Normal rate.  Pulmonary:     Effort: Pulmonary effort is normal.  Skin:    General: Skin is warm.      Capillary Refill: Capillary refill takes less than 2 seconds.  Neurological:     General: No focal deficit present.     Mental Status: She is alert and oriented to person, place, and time. Mental status is at baseline.  Psychiatric:        Mood and Affect: Mood normal.        Behavior: Behavior normal.        Thought Content: Thought content normal.        Judgment: Judgment normal.    No results found for any visits on 01/13/24.  Last hemoglobin A1c Lab Results  Component Value Date   HGBA1C 5.9 (H) 09/06/2023      The ASCVD Risk score (Arnett DK, et al., 2019) failed to calculate for the following reasons:   The 2019 ASCVD risk score is only valid for ages 92 to 27    Assessment & Plan:   Problem List Items Addressed This Visit   None Visit Diagnoses       Weight gain    -  Primary   Relevant Medications   Semaglutide,0.25 or 0.5MG /DOS, 2 MG/3ML  SOPN     Prediabetes       Relevant Medications   Semaglutide,0.25 or 0.5MG /DOS, 2 MG/3ML SOPN     BMI 32.0-32.9,adult       Relevant Medications   Semaglutide,0.25 or 0.5MG /DOS, 2 MG/3ML SOPN     Acute otitis media, unspecified otitis media type       Relevant Medications   amoxicillin-clavulanate (AUGMENTIN) 875-125 MG tablet      Weight gain -     Semaglutide(0.25 or 0.5MG /DOS); Inject 0.5 mg into the skin once a week.  Dispense: 3 mL; Refill: 1  Prediabetes -     Semaglutide(0.25 or 0.5MG /DOS); Inject 0.5 mg into the skin once a week.  Dispense: 3 mL; Refill: 1  BMI 32.0-32.9,adult -     Semaglutide(0.25 or 0.5MG /DOS); Inject 0.5 mg into the skin once a week.  Dispense: 3 mL; Refill: 1  Acute otitis media, unspecified otitis media type -     Amoxicillin-Pot Clavulanate; Take 1 tablet by mouth 2 (two) times daily.  Dispense: 14 tablet; Refill: 0   Pt with continued success with Semaglutide 0.5 mg weekly. Will remain at this dose, 10 lbs weight lost since 4 weeks ago. BMI down to 32 from 35 at initiation of the  medicine. See back in 4 weeks as scheduled for weight management  Recent flu and now with left ear feeling stopped up. Exam consistent with OM. Treat with Augmentin 875mg  BID x 7 days.   No follow-ups on file.    Suzan Slick, MD

## 2024-02-07 ENCOUNTER — Encounter: Payer: Self-pay | Admitting: Family Medicine

## 2024-02-10 ENCOUNTER — Ambulatory Visit (INDEPENDENT_AMBULATORY_CARE_PROVIDER_SITE_OTHER): Admitting: Family Medicine

## 2024-02-10 ENCOUNTER — Encounter: Payer: Self-pay | Admitting: Family Medicine

## 2024-02-10 DIAGNOSIS — Z6832 Body mass index (BMI) 32.0-32.9, adult: Secondary | ICD-10-CM

## 2024-02-10 DIAGNOSIS — R635 Abnormal weight gain: Secondary | ICD-10-CM | POA: Diagnosis not present

## 2024-02-10 DIAGNOSIS — R7303 Prediabetes: Secondary | ICD-10-CM | POA: Diagnosis not present

## 2024-02-10 MED ORDER — SEMAGLUTIDE (1 MG/DOSE) 4 MG/3ML ~~LOC~~ SOPN: 1.0000 mg | PEN_INJECTOR | SUBCUTANEOUS | 1 refills | Status: DC

## 2024-02-10 NOTE — Progress Notes (Signed)
 Established Patient Office Visit  Subjective   Patient ID: Tonya Smith, female    DOB: 1996-02-28  Age: 28 y.o. MRN: [id number]  Chief Complaint  Patient presents with   Follow-up    Patient is here for a follow up    HPI  Weight management Pt here for 4 week follow up . Been on Semaglutide 0.5mg  weekly. Tolerating medicine. No GI effects reported. She has lost 1 lb since last visit. She has IUD as birth control.  Review of Systems  All other systems reviewed and are negative.    Objective:     BP 111/78   Pulse 87   Temp 98.1 F (36.7 C) (Oral)   Resp 18   Ht 5\' 8"  (1.727 m)   Wt 215 lb 9.6 oz (97.8 kg)   SpO2 100%   BMI 32.78 kg/m  BP Readings from Last 3 Encounters:  02/10/24 111/78  01/13/24 106/71  12/23/23 103/70      Physical Exam Vitals and nursing note reviewed.  Constitutional:      Appearance: Normal appearance. She is normal weight.  HENT:     Head: Normocephalic and atraumatic.     Right Ear: External ear normal.     Left Ear: External ear normal.     Nose: Nose normal.     Mouth/Throat:     Mouth: Mucous membranes are moist.     Pharynx: Oropharynx is clear.  Eyes:     Conjunctiva/sclera: Conjunctivae normal.     Pupils: Pupils are equal, round, and reactive to light.  Cardiovascular:     Rate and Rhythm: Normal rate.  Pulmonary:     Effort: Pulmonary effort is normal.  Skin:    General: Skin is warm.     Capillary Refill: Capillary refill takes less than 2 seconds.  Neurological:     General: No focal deficit present.     Mental Status: She is alert and oriented to person, place, and time. Mental status is at baseline.  Psychiatric:        Mood and Affect: Mood normal.        Behavior: Behavior normal.        Thought Content: Thought content normal.        Judgment: Judgment normal.    No results found for any visits on 02/10/24.  Last hemoglobin A1c Lab Results  Component Value Date   HGBA1C 5.9 (H) 09/06/2023       The ASCVD Risk score (Arnett DK, et al., 2019) failed to calculate for the following reasons:   The 2019 ASCVD risk score is only valid for ages 41 to 87    Assessment & Plan:   Problem List Items Addressed This Visit   None  Weight gain -     Semaglutide (1 MG/DOSE); Inject 1 mg as directed once a week.  Dispense: 3 mL; Refill: 1  Prediabetes -     Semaglutide (1 MG/DOSE); Inject 1 mg as directed once a week.  Dispense: 3 mL; Refill: 1  BMI 32.0-32.9,adult -     Semaglutide (1 MG/DOSE); Inject 1 mg as directed once a week.  Dispense: 3 mL; Refill: 1   Due to losing 1 lb in a month, will titrate Semaglutide from 0.5mg  weekly to 1mg  weekly. Discussed possible side effects with pt including serious ones gallbladder disease  or pancreatitis. She is aware of side effects and would still like to proceed with 1 mg dose. To monitor for effects and  see back in 4 weeks.  No follow-ups on file.    Suzan Slick, MD

## 2024-03-09 ENCOUNTER — Ambulatory Visit: Admitting: Family Medicine

## 2024-03-22 ENCOUNTER — Ambulatory Visit (INDEPENDENT_AMBULATORY_CARE_PROVIDER_SITE_OTHER): Admitting: Family Medicine

## 2024-03-22 ENCOUNTER — Encounter: Payer: Self-pay | Admitting: Family Medicine

## 2024-03-22 VITALS — BP 100/69 | HR 92 | Temp 98.1°F | Resp 18 | Ht 68.0 in | Wt 215.2 lb

## 2024-03-22 DIAGNOSIS — F411 Generalized anxiety disorder: Secondary | ICD-10-CM | POA: Diagnosis not present

## 2024-03-22 DIAGNOSIS — R635 Abnormal weight gain: Secondary | ICD-10-CM

## 2024-03-22 DIAGNOSIS — R7303 Prediabetes: Secondary | ICD-10-CM | POA: Diagnosis not present

## 2024-03-22 DIAGNOSIS — Z6832 Body mass index (BMI) 32.0-32.9, adult: Secondary | ICD-10-CM | POA: Diagnosis not present

## 2024-03-22 MED ORDER — SEMAGLUTIDE (1 MG/DOSE) 4 MG/3ML ~~LOC~~ SOPN: 1.0000 mg | PEN_INJECTOR | SUBCUTANEOUS | 1 refills | Status: DC

## 2024-03-22 MED ORDER — SERTRALINE HCL 100 MG PO TABS: 100.0000 mg | ORAL_TABLET | Freq: Every day | ORAL | 1 refills | Status: DC

## 2024-03-22 NOTE — Progress Notes (Signed)
 Established Patient Office Visit  Subjective   Patient ID: Tonya Smith, female    DOB: 02-24-1996  Age: 28 y.o. MRN: [id number]  Chief Complaint  Patient presents with   Follow-up    Patient is here for a follow up for Ozempic . Patient states that she has no concerns at this time    HPI  Weight management Pt on Semaglutide  1mg  weekly. Tolerating well. Weight stable.  She feels like it's not working as well as it was initially. She would like to stay on current dose for now. She will continue to work on diet and exercise.  Anxiety Pt on Sertraline 100mg  daily. She was getting this from her psychiatrist. Orlinda Blackbird been stable on this dose and would like to get refills here. She needs this refilled today.  Review of Systems  All other systems reviewed and are negative.    Objective:     BP 100/69   Pulse 92   Temp 98.1 F (36.7 C) (Oral)   Resp 18   Ht 5\' 8"  (1.727 m)   Wt 215 lb 3.2 oz (97.6 kg)   SpO2 100%   BMI 32.72 kg/m  BP Readings from Last 3 Encounters:  03/22/24 100/69  02/10/24 111/78  01/13/24 106/71      Physical Exam Vitals and nursing note reviewed.  Constitutional:      Appearance: Normal appearance. She is normal weight.  HENT:     Head: Normocephalic and atraumatic.     Right Ear: External ear normal.     Left Ear: External ear normal.     Nose: Nose normal.     Mouth/Throat:     Mouth: Mucous membranes are moist.     Pharynx: Oropharynx is clear.  Eyes:     Conjunctiva/sclera: Conjunctivae normal.     Pupils: Pupils are equal, round, and reactive to light.  Cardiovascular:     Rate and Rhythm: Normal rate and regular rhythm.  Pulmonary:     Effort: Pulmonary effort is normal.     Breath sounds: Normal breath sounds.  Skin:    General: Skin is warm.     Capillary Refill: Capillary refill takes less than 2 seconds.  Neurological:     General: No focal deficit present.     Mental Status: She is alert and oriented to person, place, and  time. Mental status is at baseline.  Psychiatric:        Mood and Affect: Mood normal.        Behavior: Behavior normal.        Thought Content: Thought content normal.        Judgment: Judgment normal.    No results found for any visits on 03/22/24.    The ASCVD Risk score (Arnett DK, et al., 2019) failed to calculate for the following reasons:   The 2019 ASCVD risk score is only valid for ages 24 to 49    Assessment & Plan:   Problem List Items Addressed This Visit   None  Generalized anxiety disorder -     Sertraline HCl; Take 1 tablet (100 mg total) by mouth daily.  Dispense: 90 tablet; Refill: 1  Weight gain -     Semaglutide  (1 MG/DOSE); Inject 1 mg as directed once a week.  Dispense: 3 mL; Refill: 1  Prediabetes -     Semaglutide  (1 MG/DOSE); Inject 1 mg as directed once a week.  Dispense: 3 mL; Refill: 1  BMI 32.0-32.9,adult -  Semaglutide  (1 MG/DOSE); Inject 1 mg as directed once a week.  Dispense: 3 mL; Refill: 1   Pt with GAD on Sertraline 100mg  daily. Needs this refilled today. She's on semaglutide  1 mg weekly for weight and prediabetes. Tolerating well. Needs this refilled.  No follow-ups on file.    Manette Section, MD

## 2024-03-23 ENCOUNTER — Other Ambulatory Visit: Payer: Self-pay | Admitting: Family Medicine

## 2024-03-23 DIAGNOSIS — E559 Vitamin D deficiency, unspecified: Secondary | ICD-10-CM

## 2024-04-03 ENCOUNTER — Encounter: Payer: Self-pay | Admitting: Family Medicine

## 2024-04-17 ENCOUNTER — Other Ambulatory Visit: Payer: Self-pay | Admitting: Family Medicine

## 2024-05-14 NOTE — Progress Notes (Signed)
 Follow-up status post robotic cholecystectomy on 04/24/2024 by Dr. Medford Lukes.  Pathology report showing chronic cholecystitis and cholelithiasis.  Overall she is doing well.  She is eating and drinking.  Denies any postprandial pain, nausea, vomiting.  She denies any issues with diarrhea but has noticed her bowel movements been a little bit more loose.  Abdomen soft, nontender, nondistended. Incisions look good.  They have all healed nicely.  Typical palpable scarring ridge noted.  Pathology report reviewed with the patient.  We discussed her lifting and activity restrictions.  Questions were answered.  She is very pleased with the outcome of her surgery.  She is released of our care.  Will be happy to see her back on an as-needed basis.

## 2024-06-27 ENCOUNTER — Encounter: Payer: Self-pay | Admitting: Family Medicine

## 2024-06-27 DIAGNOSIS — F411 Generalized anxiety disorder: Secondary | ICD-10-CM

## 2024-07-03 ENCOUNTER — Ambulatory Visit (INDEPENDENT_AMBULATORY_CARE_PROVIDER_SITE_OTHER): Payer: Self-pay | Admitting: Psychiatry

## 2024-07-03 ENCOUNTER — Encounter (HOSPITAL_COMMUNITY): Payer: Self-pay | Admitting: Psychiatry

## 2024-07-03 DIAGNOSIS — F411 Generalized anxiety disorder: Secondary | ICD-10-CM | POA: Diagnosis not present

## 2024-07-03 DIAGNOSIS — R41840 Attention and concentration deficit: Secondary | ICD-10-CM | POA: Diagnosis not present

## 2024-07-03 MED ORDER — SERTRALINE HCL 100 MG PO TABS: 100.0000 mg | ORAL_TABLET | Freq: Every day | ORAL | 0 refills | Status: AC

## 2024-07-03 NOTE — Progress Notes (Signed)
 Psychiatric Initial Adult Assessment   Patient Identification: Tonya Smith MRN:  [id number] Date of Evaluation:  07/03/2024 Referral Source: primary care Chief Complaint:   Chief Complaint  Patient presents with   Anxiety   Establish Care   Visit Diagnosis:    ICD-10-CM   1. Generalized anxiety disorder  F41.1 sertraline  (ZOLOFT ) 100 MG tablet    2. Inattention  R41.840      Virtual Visit via Video Note  I connected with Tonya Smith on 07/03/24 at  2:00 PM EDT by a video enabled telemedicine application and verified that I am speaking with the correct person using two identifiers.  Location: Patient: home Provider: home office   I discussed the limitations of evaluation and management by telemedicine and the availability of in person appointments. The patient expressed understanding and agreed to proceed.      I discussed the assessment and treatment plan with the patient. The patient was provided an opportunity to ask questions and all were answered. The patient agreed with the plan and demonstrated an understanding of the instructions.   The patient was advised to call back or seek an in-person evaluation if the symptoms worsen or if the condition fails to improve as anticipated.  I provided 45 minutes of non-face-to-face time during this encounter.   History of Present Illness: Patient is a 28 years old white female living with her boyfriend she has 2 kids aged 92 and age nearly 1 year she is working full-time in the dental office the building, referred from a primary care physician establish care with prior history of anxiety and currently on sertraline .  Patient states she is doing reasonably well with her sertraline  at current dose of 100 mg she has had started couple of years ago because of anxiety excessive worry and at that time she was having difficulty in relationship.  She feels 100 milligram is helping but she is also feeling some brain zaps if she misses her  dose.  Other than that she feels that she has some inattention and cannot focus at times she is able to get her job done but she feels at times amotivated or distracted and wanted to get evaluated.  Does not endorse psychotic symptoms delusions or hallucinations.  Does not endorse depression on a day-to-day basis no history of mania or bipolar  Patient does not use any drugs or any alcohol  Aggravating factors; past relationship stress  Modifying factors her kids, relationship is going along well  Duration 2 to 3 years  Severity anxiety is manageable gets out of focus at times.  She has work on weight maintenance currently she is 230 pounds height is 5 foot 6 inches she was on weight loss medication but that was recently stopped    Past Psychiatric History: anxiety  Previous Psychotropic Medications: No   Substance Abuse History in the last 12 months:  No.  Consequences of Substance Abuse: NA  Past Medical History:  Past Medical History:  Diagnosis Date   Anxiety    Vaginal Pap smear, abnormal     Past Surgical History:  Procedure Laterality Date   broken leg     screws and rods in L leg   TONSILLECTOMY     wisdom teeth removal      Family Psychiatric History: mom: anxiety  Family History:  Family History  Problem Relation Age of Onset   Hypertension Mother    Hyperlipidemia Father    Diabetes Father    Hypertension Father  Social History:   Social History   Socioeconomic History   Marital status: Single    Spouse name: Not on file   Number of children: Not on file   Years of education: Not on file   Highest education level: GED or equivalent  Occupational History   Not on file  Tobacco Use   Smoking status: Former    Current packs/day: 1.00    Types: Cigarettes   Smokeless tobacco: Never  Vaping Use   Vaping status: Never Used  Substance and Sexual Activity   Alcohol use: Yes    Comment: occasionally   Drug use: No   Sexual activity: Yes   Other Topics Concern   Not on file  Social History Narrative   Not on file   Social Drivers of Health   Financial Resource Strain: Low Risk  (04/16/2024)   Received from [facility]   Overall Financial Resource Strain (CARDIA)    Difficulty of Paying Living Expenses: Not hard at all  Food Insecurity: No Food Insecurity (04/16/2024)   Received from [facility]   Hunger Vital Sign    Within the past 12 months, you worried that your food would run out before you got the money to buy more.: Never true    Within the past 12 months, the food you bought just didn't last and you didn't have money to get more.: Never true  Transportation Needs: No Transportation Needs (04/16/2024)   Received from [facility] - Transportation    Lack of Transportation (Medical): No    Lack of Transportation (Non-Medical): No  Physical Activity: Insufficiently Active (04/16/2024)   Received from [facility]   Exercise Vital Sign    On average, how many days per week do you engage in moderate to strenuous exercise (like a brisk walk)?: 4 days    On average, how many minutes do you engage in exercise at this level?: 30 min  Stress: No Stress Concern Present (04/24/2024)   Received from [facility] - Occupational Stress Questionnaire    Feeling of Stress : Not at all  Social Connections: Socially Integrated (04/16/2024)   [facility] from [facility]   Social Network    How would you rate your social network (family, work, friends)?: Good participation with social networks    Additional Social History: grew up with parents, no trauma. Lives with BF and has 2 kids, works full time  Allergies:  No Known Allergies  Metabolic Disorder Labs: Lab Results  Component Value Date   HGBA1C 5.9 (H) 09/06/2023   MPG 105 04/12/2018   No results found for: PROLACTIN No results found for: CHOL, TRIG, HDL, CHOLHDL, VLDL, LDLCALC Lab Results   Component Value Date   TSH 0.844 09/06/2023    Therapeutic Level Labs: No results found for: LITHIUM No results found for: CBMZ No results found for: VALPROATE  Current Medications: Current Outpatient Medications  Medication Sig Dispense Refill   sertraline  (ZOLOFT ) 100 MG tablet Take 1 tablet (100 mg total) by mouth daily. 90 tablet 0   Vitamin D , Ergocalciferol , (DRISDOL ) 1.25 MG (50000 UNIT) CAPS capsule TAKE 1 CAPSULE (50,000 UNITS TOTAL) BY MOUTH EVERY 7 (SEVEN) DAYS 5 capsule 1   No current facility-administered medications for this visit.     Psychiatric Specialty Exam: Review of Systems  Cardiovascular:  Negative for chest pain.  Neurological:  Negative for tremors.  Psychiatric/Behavioral:  Positive for decreased concentration. Negative for suicidal  ideas.     There were no vitals taken for this visit.There is no height or weight on file to calculate BMI.  General Appearance: Casual  Eye Contact:  Fair  Speech:  Normal Rate  Volume:  Decreased  Mood:  Euthymic  Affect:  Congruent  Thought Process:  Goal Directed  Orientation:  Full (Time, Place, and Person)  Thought Content:  Rumination  Suicidal Thoughts:  No  Homicidal Thoughts:  No  Memory:  Immediate;   Fair  Judgement:  Fair  Insight:  Fair  Psychomotor Activity:  Normal  Concentration:  Concentration: Fair  Recall:  Fair  Fund of Knowledge:Good  Language: Good  Akathisia:  No  Handed:    AIMS (if indicated):  not done  Assets:  Desire for Improvement Financial Resources/Insurance Social Support  ADL's:  Intact  Cognition: WNL  Sleep:  Fair   Screenings: AUDIT    Flowsheet Row Office Visit from 11/04/2023 in [facility]  Alcohol Use Disorder Identification Test Final Score (AUDIT) 4    GAD-7    Flowsheet Row Office Visit from 09/06/2023 in [facility]  Total GAD-7 Score 3   PHQ2-9    Flowsheet Row Office Visit from  07/03/2024 in [facility] Office Visit from 09/06/2023 in [facility] at [facility] Total Score 0 0  PHQ-9 Total Score -- 12   Flowsheet Row Office Visit from 07/03/2024 in [facility] from 08/18/2022 in [facility] [place] [facility] from 12/07/2021 in [facility] RISK CATEGORY No Risk No Risk Error: Question 6 not populated    Assessment and Plan: as follows  Generalized anxiety disorder; she is doing reasonably well we talked about cutting down the dose if she is worried about some withdrawal effect on Zoloft  when she misses her dose but she feels content and wants to take it regularly so that she does not have any withdrawal effects.  We discussed option of changing it to Prozac if needed.  Inattention; I discussed that for ruling out ADHD she may have to get a proper ADHD testing done but stimulants also can cause anxiety.  We also discussed anxiety symptoms can mimic distraction and she agrees to continue sertraline  for now as her inattention symptoms are not severe.  She will work on Doctor, hospital and work on sleep hygiene  Discussed sleep hygiene and also possible rule out sleep apnea as she does snore and there has been some weight fluctuations.  Discussed sleep apnea can also contribute to inattention and fogginess she will look into getting a sleep clinic appointment and rule out sleep apnea  I send sertraline  reviewed medication questions addressed follow-up in 2 to 3 months or earlier if needed she will also work on weight maintenance and follow-up with her primary care physician on a regular basis regarding any medical comorbidity.  Collaboration of Care: Primary Care Provider AEB notes and chart reviewed  Patient/Guardian was advised Release of Information must be obtained prior to any record release in  order to collaborate their care with an outside provider. Patient/Guardian was advised if they have not already done so to contact the registration department to sign all necessary forms in order for us  to release information regarding their care.   Consent: Patient/Guardian gives verbal consent for treatment and assignment of  benefits for services provided during this visit. Patient/Guardian expressed understanding and agreed to proceed.   Jackey Flight, MD 9/2/20252:22 PM

## 2024-09-06 ENCOUNTER — Encounter (HOSPITAL_COMMUNITY): Payer: Self-pay

## 2024-09-26 ENCOUNTER — Encounter (HOSPITAL_COMMUNITY): Payer: Self-pay

## 2024-09-26 ENCOUNTER — Telehealth (HOSPITAL_COMMUNITY): Admitting: Psychiatry

## 2024-10-26 ENCOUNTER — Telehealth: Payer: Self-pay | Admitting: Pharmacy Technician

## 2024-10-26 NOTE — Telephone Encounter (Signed)
 Pharmacy Patient Advocate Encounter   Received notification from Onbase that prior authorization for Ozempic  (0.25 or 0.5 MG/DOSE) 2MG /3ML pen-injectors is due for renewal.   Insurance verification completed.   The patient is insured through [facility] MEDICAID.  Action: Medication has been discontinued. Archived Key: [id number]

## 2024-11-14 ENCOUNTER — Ambulatory Visit (HOSPITAL_COMMUNITY): Admitting: Registered Nurse
